# Patient Record
Sex: Male | Born: 1940
Health system: Southern US, Community
[De-identification: ages and names within clinical notes are randomized; demographics above are authoritative.]

## PROBLEM LIST (undated history)

## (undated) DIAGNOSIS — J189 Pneumonia, unspecified organism: Secondary | ICD-10-CM

## (undated) DIAGNOSIS — J628 Pneumoconiosis due to other dust containing silica: Secondary | ICD-10-CM

## (undated) DIAGNOSIS — Z87442 Personal history of urinary calculi: Secondary | ICD-10-CM

---

## 1992-07-23 ENCOUNTER — Encounter: Payer: Self-pay | Admitting: Pulmonary Disease

## 2008-08-26 ENCOUNTER — Ambulatory Visit: Payer: Self-pay | Admitting: Pulmonary Disease

## 2008-08-26 DIAGNOSIS — J342 Deviated nasal septum: Secondary | ICD-10-CM

## 2008-08-26 DIAGNOSIS — J628 Pneumoconiosis due to other dust containing silica: Secondary | ICD-10-CM

## 2008-08-26 DIAGNOSIS — J309 Allergic rhinitis, unspecified: Secondary | ICD-10-CM | POA: Insufficient documentation

## 2008-08-26 DIAGNOSIS — R05 Cough: Secondary | ICD-10-CM

## 2008-08-26 DIAGNOSIS — R059 Cough, unspecified: Secondary | ICD-10-CM | POA: Insufficient documentation

## 2008-08-26 LAB — CONVERTED CEMR LAB
BUN: 28 mg/dL — ABNORMAL HIGH (ref 6–23)
CO2: 29 meq/L (ref 19–32)
Calcium: 9 mg/dL (ref 8.4–10.5)
Chloride: 107 meq/L (ref 96–112)
Creatinine, Ser: 1.1 mg/dL (ref 0.4–1.5)
GFR calc Af Amer: 86 mL/min
GFR calc non Af Amer: 71 mL/min
Glucose, Bld: 126 mg/dL — ABNORMAL HIGH (ref 70–99)
Potassium: 3.9 meq/L (ref 3.5–5.1)
Sodium: 143 meq/L (ref 135–145)

## 2008-08-27 ENCOUNTER — Ambulatory Visit: Payer: Self-pay | Admitting: Pulmonary Disease

## 2008-08-27 ENCOUNTER — Ambulatory Visit: Payer: Self-pay | Admitting: Internal Medicine

## 2008-08-27 ENCOUNTER — Telehealth (INDEPENDENT_AMBULATORY_CARE_PROVIDER_SITE_OTHER): Payer: Self-pay | Admitting: *Deleted

## 2008-08-28 ENCOUNTER — Encounter: Payer: Self-pay | Admitting: Pulmonary Disease

## 2008-09-08 ENCOUNTER — Ambulatory Visit: Payer: Self-pay | Admitting: Pulmonary Disease

## 2010-06-30 ENCOUNTER — Inpatient Hospital Stay (HOSPITAL_COMMUNITY)
Admission: EM | Admit: 2010-06-30 | Discharge: 2010-07-06 | Payer: Self-pay | Source: Home / Self Care | Attending: Internal Medicine | Admitting: Internal Medicine

## 2010-07-29 NOTE — H&P (Signed)
[name], [name]:  [id number]  MEDICAL RECORD Jason.:  [id number]          PATIENT TYPE:  INP  LOCATION:  4707                         FACILITY:  [facility]  PHYSICIAN:  Massie Maroon, MD        DATE OF BIRTH:  [birth year]  DATE OF ADMISSION:  06/30/2010 DATE OF DISCHARGE:                             HISTORY & PHYSICAL   HISTORY OF PRESENT ILLNESS:  A 70 year old male with complaints of fever and chills.  The patient was worried that he might have some form of infection and presented to the ER.  His temperature was noted to be 102.3 in the ED and he had an elevated white count.  Chest x-ray showed extensive scarring and bronchiectasis throughout both lungs with involvement of scar in the upper lobes bilaterally consistent with known history of silicosis, not significantly changed since February 2010.  Jason new acute cardiopulmonary disease.  The patient's white count was elevated as stated before and the patient will be admitted for workup of possible pneumonia.  The patient will be admitted for workup of fever.  PAST MEDICAL HISTORY:  Allergic rhinitis.  PAST SURGICAL HISTORY:  T&A, colonoscopy 3 years ago.  SOCIAL HISTORY:  The patient lives at home.  He is married and has four children.  He used to be a Chief of Staff.  The patient does not smoke or drink at the present time.  FAMILY HISTORY:  Mother is alive at age 31.  Father died at age 17 from pneumonia and had a history of throat cancer.  He was a smoker.  ALLERGIES:  Jason known drug allergies.  MEDICATIONS:  Allegra, Flonase.  REVIEW OF SYSTEMS:  Negative for all 10 organ systems except for pertinent positives as stated above.  PHYSICAL EXAM:  VITAL SIGNS:  Temperature 102.3, pulse 158, blood pressures 100/65, pulse ox is 97% on room air. HEENT:  Anicteric. NECK:  Jason JVD, Jason bruit. HEART:  Regular rate and rhythm.  S1,S2. LUNGS:  Slight crackles at bilateral bases, Jason wheezes. ABDOMEN:   Soft, nontender, nondistended.  Positive bowel sounds. EXTREMITIES:  Jason cyanosis, clubbing, or edema. SKIN:  Jason rashes. LYMPH NODES:  Jason adenopathy. NEURO:  Nonfocal.  LABS:  Calcitonin 2.39, lactic acid 1.4.  Urinalysis negative.  Sodium 135, potassium 4.1, BUN 24, creatinine 1.33.  WBC 21.1, hemoglobin 14.2, platelet count 437 with slight left shift.  Chest x-ray as stated above HPI.  ASSESSMENT/PLAN:  Fever:  The patient's source of fever is most likely pulmonary.  He was treated with Levaquin for pneumonia and this may not have completely cleared his lung disease.  The patient will be started on vancomycin, IV ceftriaxone, and Zithromax.  Blood cultures x2 sets are pending.  We will repeat his CBC, CMP in the a.m.  The patient had Jason symptoms or signs of endocarditis.  If fevers persist and all blood cultures are positive, please consider obtaining a cardiac 2-D echo.  DVT prophylaxis, SCDs.     Massie Maroon, MD     JYK/MEDQ  D:  07/01/2010  T:  07/01/2010  Job:  [id number]  Electronically  Signed by Pearson Grippe MD on 07/29/2010 09:26:19 PM

## 2010-09-12 LAB — URINE CULTURE
Colony Count: NO GROWTH
Culture  Setup Time: 201112300054
Culture: NO GROWTH

## 2010-09-12 LAB — COMPREHENSIVE METABOLIC PANEL
ALT: 43 U/L (ref 0–53)
ALT: 47 U/L (ref 0–53)
ALT: 48 U/L (ref 0–53)
AST: 40 U/L — ABNORMAL HIGH (ref 0–37)
AST: 42 U/L — ABNORMAL HIGH (ref 0–37)
AST: 45 U/L — ABNORMAL HIGH (ref 0–37)
Albumin: 2.4 g/dL — ABNORMAL LOW (ref 3.5–5.2)
Albumin: 2.4 g/dL — ABNORMAL LOW (ref 3.5–5.2)
Albumin: 2.4 g/dL — ABNORMAL LOW (ref 3.5–5.2)
Alkaline Phosphatase: 118 U/L — ABNORMAL HIGH (ref 39–117)
Alkaline Phosphatase: 155 U/L — ABNORMAL HIGH (ref 39–117)
Alkaline Phosphatase: 181 U/L — ABNORMAL HIGH (ref 39–117)
BUN: 10 mg/dL (ref 6–23)
BUN: 15 mg/dL (ref 6–23)
BUN: 21 mg/dL (ref 6–23)
CO2: 24 mEq/L (ref 19–32)
CO2: 25 mEq/L (ref 19–32)
CO2: 28 mEq/L (ref 19–32)
Calcium: 8.5 mg/dL (ref 8.4–10.5)
Calcium: 8.7 mg/dL (ref 8.4–10.5)
Calcium: 9 mg/dL (ref 8.4–10.5)
Chloride: 100 mEq/L (ref 96–112)
Chloride: 103 mEq/L (ref 96–112)
Chloride: 105 mEq/L (ref 96–112)
Creatinine, Ser: 0.95 mg/dL (ref 0.4–1.5)
Creatinine, Ser: 0.98 mg/dL (ref 0.4–1.5)
Creatinine, Ser: 1.18 mg/dL (ref 0.4–1.5)
GFR calc Af Amer: >60 mL/min (ref >60)
GFR calc Af Amer: >60 mL/min (ref >60)
GFR calc Af Amer: >60 mL/min (ref >60)
GFR calc non Af Amer: >60 mL/min (ref >60)
GFR calc non Af Amer: >60 mL/min (ref >60)
GFR calc non Af Amer: >60 mL/min (ref >60)
Glucose, Bld: 108 mg/dL — ABNORMAL HIGH (ref 70–99)
Glucose, Bld: 136 mg/dL — ABNORMAL HIGH (ref 70–99)
Glucose, Bld: 146 mg/dL — ABNORMAL HIGH (ref 70–99)
Potassium: 4 mEq/L (ref 3.5–5.1)
Potassium: 4.1 mEq/L (ref 3.5–5.1)
Potassium: 4.2 mEq/L (ref 3.5–5.1)
Sodium: 135 mEq/L (ref 135–145)
Sodium: 137 mEq/L (ref 135–145)
Sodium: 138 mEq/L (ref 135–145)
Total Bilirubin: 0.6 mg/dL (ref 0.3–1.2)
Total Bilirubin: 0.7 mg/dL (ref 0.3–1.2)
Total Bilirubin: 0.9 mg/dL (ref 0.3–1.2)
Total Protein: 6.1 g/dL (ref 6.0–8.3)
Total Protein: 6.3 g/dL (ref 6.0–8.3)
Total Protein: 6.9 g/dL (ref 6.0–8.3)

## 2010-09-12 LAB — LACTIC ACID, PLASMA: Lactic Acid, Venous: 1.4 mmol/L (ref 0.5–2.2)

## 2010-09-12 LAB — BASIC METABOLIC PANEL
BUN: 24 mg/dL — ABNORMAL HIGH (ref 6–23)
CO2: 24 mEq/L (ref 19–32)
Calcium: 8.9 mg/dL (ref 8.4–10.5)
Chloride: 103 mEq/L (ref 96–112)
Creatinine, Ser: 1.33 mg/dL (ref 0.4–1.5)
GFR calc Af Amer: >60 mL/min (ref >60)
GFR calc non Af Amer: 53 mL/min — ABNORMAL LOW (ref >60)
Glucose, Bld: 137 mg/dL — ABNORMAL HIGH (ref 70–99)
Potassium: 4.1 mEq/L (ref 3.5–5.1)
Sodium: 135 mEq/L (ref 135–145)

## 2010-09-12 LAB — BLOOD GAS, ARTERIAL
Acid-base deficit: 0.3 mmol/L (ref 0.0–2.0)
Bicarbonate: 22.6 mEq/L (ref 20.0–24.0)
Drawn by: 23588
O2 Content: 2 L/min
O2 Saturation: 97.2 %
Patient temperature: 98.6
TCO2: 23.5 mmol/L (ref 0–100)
pCO2 arterial: 29.2 mmHg — ABNORMAL LOW (ref 35.0–45.0)
pH, Arterial: 7.499 — ABNORMAL HIGH (ref 7.350–7.450)
pO2, Arterial: 83.5 mmHg (ref 80.0–100.0)

## 2010-09-12 LAB — DIFFERENTIAL
Basophils Absolute: 0 10*3/uL (ref 0.0–0.1)
Basophils Absolute: 0 10*3/uL (ref 0.0–0.1)
Basophils Absolute: 0 10*3/uL (ref 0.0–0.1)
Basophils Absolute: 0 10*3/uL (ref 0.0–0.1)
Basophils Relative: 0 % (ref 0–1)
Basophils Relative: 0 % (ref 0–1)
Basophils Relative: 0 % (ref 0–1)
Basophils Relative: 0 % (ref 0–1)
Eosinophils Absolute: 0 10*3/uL (ref 0.0–0.7)
Eosinophils Absolute: 0.1 10*3/uL (ref 0.0–0.7)
Eosinophils Absolute: 0.1 10*3/uL (ref 0.0–0.7)
Eosinophils Absolute: 0.2 10*3/uL (ref 0.0–0.7)
Eosinophils Relative: 0 % (ref 0–5)
Eosinophils Relative: 1 % (ref 0–5)
Eosinophils Relative: 1 % (ref 0–5)
Eosinophils Relative: 1 % (ref 0–5)
Lymphocytes Relative: 10 % — ABNORMAL LOW (ref 12–46)
Lymphocytes Relative: 2 % — ABNORMAL LOW (ref 12–46)
Lymphocytes Relative: 5 % — ABNORMAL LOW (ref 12–46)
Lymphocytes Relative: 7 % — ABNORMAL LOW (ref 12–46)
Lymphs Abs: 0.5 10*3/uL — ABNORMAL LOW (ref 0.7–4.0)
Lymphs Abs: 0.7 10*3/uL (ref 0.7–4.0)
Lymphs Abs: 1.1 10*3/uL (ref 0.7–4.0)
Lymphs Abs: 1.5 10*3/uL (ref 0.7–4.0)
Monocytes Absolute: 1.6 10*3/uL — ABNORMAL HIGH (ref 0.1–1.0)
Monocytes Absolute: 1.7 10*3/uL — ABNORMAL HIGH (ref 0.1–1.0)
Monocytes Absolute: 1.8 10*3/uL — ABNORMAL HIGH (ref 0.1–1.0)
Monocytes Absolute: 1.8 10*3/uL — ABNORMAL HIGH (ref 0.1–1.0)
Monocytes Relative: 11 % (ref 3–12)
Monocytes Relative: 12 % (ref 3–12)
Monocytes Relative: 12 % (ref 3–12)
Monocytes Relative: 8 % (ref 3–12)
Neutro Abs: 11.3 10*3/uL — ABNORMAL HIGH (ref 1.7–7.7)
Neutro Abs: 11.5 10*3/uL — ABNORMAL HIGH (ref 1.7–7.7)
Neutro Abs: 11.8 10*3/uL — ABNORMAL HIGH (ref 1.7–7.7)
Neutro Abs: 18.8 10*3/uL — ABNORMAL HIGH (ref 1.7–7.7)
Neutrophils Relative %: 77 % (ref 43–77)
Neutrophils Relative %: 81 % — ABNORMAL HIGH (ref 43–77)
Neutrophils Relative %: 82 % — ABNORMAL HIGH (ref 43–77)
Neutrophils Relative %: 89 % — ABNORMAL HIGH (ref 43–77)

## 2010-09-12 LAB — CBC
HCT: 37.5 % — ABNORMAL LOW (ref 39.0–52.0)
HCT: 38.2 % — ABNORMAL LOW (ref 39.0–52.0)
HCT: 38.5 % — ABNORMAL LOW (ref 39.0–52.0)
HCT: 41.7 % (ref 39.0–52.0)
Hemoglobin: 12.3 g/dL — ABNORMAL LOW (ref 13.0–17.0)
Hemoglobin: 12.7 g/dL — ABNORMAL LOW (ref 13.0–17.0)
Hemoglobin: 12.9 g/dL — ABNORMAL LOW (ref 13.0–17.0)
Hemoglobin: 14.2 g/dL (ref 13.0–17.0)
MCH: 28 pg (ref 26.0–34.0)
MCH: 28.4 pg (ref 26.0–34.0)
MCH: 28.7 pg (ref 26.0–34.0)
MCH: 29 pg (ref 26.0–34.0)
MCHC: 32.8 g/dL (ref 30.0–36.0)
MCHC: 33.2 g/dL (ref 30.0–36.0)
MCHC: 33.5 g/dL (ref 30.0–36.0)
MCHC: 34.1 g/dL (ref 30.0–36.0)
MCV: 85.1 fL (ref 78.0–100.0)
MCV: 85.4 fL (ref 78.0–100.0)
MCV: 85.5 fL (ref 78.0–100.0)
MCV: 85.7 fL (ref 78.0–100.0)
Platelets: 349 10*3/uL (ref 150–400)
Platelets: 359 10*3/uL (ref 150–400)
Platelets: 400 10*3/uL (ref 150–400)
Platelets: 437 10*3/uL — ABNORMAL HIGH (ref 150–400)
RBC: 4.39 MIL/uL (ref 4.22–5.81)
RBC: 4.47 MIL/uL (ref 4.22–5.81)
RBC: 4.49 MIL/uL (ref 4.22–5.81)
RBC: 4.9 MIL/uL (ref 4.22–5.81)
RDW: 14 % (ref 11.5–15.5)
RDW: 14.2 % (ref 11.5–15.5)
RDW: 14.4 % (ref 11.5–15.5)
RDW: 14.5 % (ref 11.5–15.5)
WBC: 14 10*3/uL — ABNORMAL HIGH (ref 4.0–10.5)
WBC: 14.6 10*3/uL — ABNORMAL HIGH (ref 4.0–10.5)
WBC: 14.8 10*3/uL — ABNORMAL HIGH (ref 4.0–10.5)
WBC: 21.1 10*3/uL — ABNORMAL HIGH (ref 4.0–10.5)

## 2010-09-12 LAB — URINALYSIS, ROUTINE W REFLEX MICROSCOPIC
Glucose, UA: NEGATIVE mg/dL
Hgb urine dipstick: NEGATIVE
Ketones, ur: 15 mg/dL — AB
Nitrite: NEGATIVE
Protein, ur: NEGATIVE mg/dL
Specific Gravity, Urine: 1.028 (ref 1.005–1.030)
Urobilinogen, UA: 0.2 mg/dL (ref 0.0–1.0)
pH: 5.5 (ref 5.0–8.0)

## 2010-09-12 LAB — CULTURE, BLOOD (ROUTINE X 2)
Culture  Setup Time: 201112301108
Culture  Setup Time: 201112301108
Culture: NO GROWTH
Culture: NO GROWTH

## 2010-09-12 LAB — VANCOMYCIN, TROUGH: Vancomycin Tr: 12.6 ug/mL (ref 10.0–20.0)

## 2010-09-12 LAB — PROCALCITONIN: Procalcitonin: 2.39 ng/mL

## 2011-12-20 ENCOUNTER — Ambulatory Visit
Admission: RE | Admit: 2011-12-20 | Discharge: 2011-12-20 | Disposition: A | Discharge status: Home / Self Care | Payer: Medicare HMO | Source: Ambulatory Visit | Attending: *Deleted | Admitting: *Deleted

## 2011-12-20 ENCOUNTER — Other Ambulatory Visit: Payer: Self-pay | Admitting: *Deleted

## 2011-12-20 DIAGNOSIS — M79662 Pain in left lower leg: Secondary | ICD-10-CM

## 2014-08-12 DIAGNOSIS — Z23 Encounter for immunization: Secondary | ICD-10-CM | POA: Diagnosis not present

## 2014-08-12 DIAGNOSIS — J309 Allergic rhinitis, unspecified: Secondary | ICD-10-CM | POA: Diagnosis not present

## 2014-08-12 DIAGNOSIS — R030 Elevated blood-pressure reading, without diagnosis of hypertension: Secondary | ICD-10-CM | POA: Diagnosis not present

## 2014-08-12 DIAGNOSIS — E782 Mixed hyperlipidemia: Secondary | ICD-10-CM | POA: Diagnosis not present

## 2014-08-12 DIAGNOSIS — N200 Calculus of kidney: Secondary | ICD-10-CM | POA: Diagnosis not present

## 2014-08-12 DIAGNOSIS — R7309 Other abnormal glucose: Secondary | ICD-10-CM | POA: Diagnosis not present

## 2014-08-12 DIAGNOSIS — Z0000 Encounter for general adult medical examination without abnormal findings: Secondary | ICD-10-CM | POA: Diagnosis not present

## 2014-08-12 DIAGNOSIS — N183 Chronic kidney disease, stage 3 (moderate): Secondary | ICD-10-CM | POA: Diagnosis not present

## 2015-01-07 DIAGNOSIS — Z0100 Encounter for examination of eyes and vision without abnormal findings: Secondary | ICD-10-CM | POA: Diagnosis not present

## 2015-01-07 DIAGNOSIS — H524 Presbyopia: Secondary | ICD-10-CM | POA: Diagnosis not present

## 2015-01-07 DIAGNOSIS — H5210 Myopia, unspecified eye: Secondary | ICD-10-CM | POA: Diagnosis not present

## 2015-06-23 DIAGNOSIS — B353 Tinea pedis: Secondary | ICD-10-CM | POA: Diagnosis not present

## 2015-06-23 DIAGNOSIS — L570 Actinic keratosis: Secondary | ICD-10-CM | POA: Diagnosis not present

## 2015-06-23 DIAGNOSIS — L720 Epidermal cyst: Secondary | ICD-10-CM | POA: Diagnosis not present

## 2015-06-23 DIAGNOSIS — L218 Other seborrheic dermatitis: Secondary | ICD-10-CM | POA: Diagnosis not present

## 2015-06-23 DIAGNOSIS — B351 Tinea unguium: Secondary | ICD-10-CM | POA: Diagnosis not present

## 2015-06-23 DIAGNOSIS — L821 Other seborrheic keratosis: Secondary | ICD-10-CM | POA: Diagnosis not present

## 2015-07-06 DIAGNOSIS — J069 Acute upper respiratory infection, unspecified: Secondary | ICD-10-CM | POA: Diagnosis not present

## 2015-07-26 DIAGNOSIS — R05 Cough: Secondary | ICD-10-CM | POA: Diagnosis not present

## 2015-07-26 DIAGNOSIS — J310 Chronic rhinitis: Secondary | ICD-10-CM | POA: Diagnosis not present

## 2015-08-03 ENCOUNTER — Other Ambulatory Visit: Payer: Self-pay | Admitting: Internal Medicine

## 2015-08-03 ENCOUNTER — Ambulatory Visit
Admission: RE | Admit: 2015-08-03 | Discharge: 2015-08-03 | Disposition: A | Discharge status: Home / Self Care | Payer: Commercial Managed Care - HMO | Source: Ambulatory Visit | Attending: Internal Medicine | Admitting: Internal Medicine

## 2015-08-03 DIAGNOSIS — R05 Cough: Secondary | ICD-10-CM

## 2015-08-03 DIAGNOSIS — R059 Cough, unspecified: Secondary | ICD-10-CM

## 2015-08-03 DIAGNOSIS — J628 Pneumoconiosis due to other dust containing silica: Secondary | ICD-10-CM | POA: Diagnosis not present

## 2015-08-05 ENCOUNTER — Other Ambulatory Visit: Payer: Self-pay | Admitting: Internal Medicine

## 2015-08-05 DIAGNOSIS — J628 Pneumoconiosis due to other dust containing silica: Secondary | ICD-10-CM | POA: Diagnosis not present

## 2015-08-05 DIAGNOSIS — R9389 Abnormal findings on diagnostic imaging of other specified body structures: Secondary | ICD-10-CM

## 2015-08-05 DIAGNOSIS — R05 Cough: Secondary | ICD-10-CM

## 2015-08-05 DIAGNOSIS — R059 Cough, unspecified: Secondary | ICD-10-CM

## 2015-08-06 ENCOUNTER — Encounter (HOSPITAL_COMMUNITY): Payer: Self-pay | Admitting: *Deleted

## 2015-08-06 DIAGNOSIS — K567 Ileus, unspecified: Secondary | ICD-10-CM | POA: Diagnosis not present

## 2015-08-06 DIAGNOSIS — J628 Pneumoconiosis due to other dust containing silica: Secondary | ICD-10-CM | POA: Diagnosis present

## 2015-08-06 DIAGNOSIS — R0600 Dyspnea, unspecified: Secondary | ICD-10-CM | POA: Diagnosis not present

## 2015-08-06 DIAGNOSIS — K819 Cholecystitis, unspecified: Secondary | ICD-10-CM | POA: Diagnosis not present

## 2015-08-06 DIAGNOSIS — K810 Acute cholecystitis: Secondary | ICD-10-CM | POA: Diagnosis present

## 2015-08-06 DIAGNOSIS — R109 Unspecified abdominal pain: Secondary | ICD-10-CM | POA: Diagnosis not present

## 2015-08-06 DIAGNOSIS — R1084 Generalized abdominal pain: Secondary | ICD-10-CM | POA: Diagnosis present

## 2015-08-06 DIAGNOSIS — K812 Acute cholecystitis with chronic cholecystitis: Secondary | ICD-10-CM | POA: Diagnosis not present

## 2015-08-06 NOTE — ED Notes (Signed)
The pt ate salmon at a seafood place tonight  And shortly afterward he has had severe abd pain  And burping  No n v or diarrhea.   hehas a grey color to his skin and his abd is distended.  No one else was sick from the food.  .  He has had a cough for 5 weeks.

## 2015-08-07 ENCOUNTER — Observation Stay (HOSPITAL_COMMUNITY): Payer: Commercial Managed Care - HMO | Admitting: Anesthesiology

## 2015-08-07 ENCOUNTER — Inpatient Hospital Stay (HOSPITAL_COMMUNITY)
Admission: EM | Admit: 2015-08-07 | Discharge: 2015-08-10 | DRG: 418 | Disposition: A | Discharge status: Home / Self Care | Payer: Commercial Managed Care - HMO | Attending: General Surgery | Admitting: General Surgery

## 2015-08-07 ENCOUNTER — Encounter (HOSPITAL_COMMUNITY): Payer: Self-pay | Admitting: *Deleted

## 2015-08-07 ENCOUNTER — Encounter (HOSPITAL_COMMUNITY): Admission: EM | Disposition: A | Discharge status: Home / Self Care | Payer: Self-pay | Source: Home / Self Care

## 2015-08-07 ENCOUNTER — Emergency Department (HOSPITAL_COMMUNITY): Payer: Commercial Managed Care - HMO

## 2015-08-07 DIAGNOSIS — J628 Pneumoconiosis due to other dust containing silica: Secondary | ICD-10-CM

## 2015-08-07 DIAGNOSIS — K810 Acute cholecystitis: Secondary | ICD-10-CM | POA: Diagnosis present

## 2015-08-07 DIAGNOSIS — K812 Acute cholecystitis with chronic cholecystitis: Secondary | ICD-10-CM | POA: Diagnosis not present

## 2015-08-07 HISTORY — DX: Pneumoconiosis due to other dust containing silica: J62.8

## 2015-08-07 HISTORY — PX: CHOLECYSTECTOMY: SHX55

## 2015-08-07 HISTORY — DX: Pneumonia, unspecified organism: J18.9

## 2015-08-07 HISTORY — DX: Personal history of urinary calculi: Z87.442

## 2015-08-07 LAB — CBC
HEMATOCRIT: 37.9 % — AB (ref 39.0–52.0)
HEMATOCRIT: 43.8 % (ref 39.0–52.0)
HEMOGLOBIN: 12.9 g/dL — AB (ref 13.0–17.0)
Hemoglobin: 15 g/dL (ref 13.0–17.0)
MCH: 29.5 pg (ref 26.0–34.0)
MCH: 29.9 pg (ref 26.0–34.0)
MCHC: 34 g/dL (ref 30.0–36.0)
MCHC: 34.2 g/dL (ref 30.0–36.0)
MCV: 86.5 fL (ref 78.0–100.0)
MCV: 87.3 fL (ref 78.0–100.0)
PLATELETS: 220 10*3/uL (ref 150–400)
Platelets: 186 10*3/uL (ref 150–400)
RBC: 4.38 MIL/uL (ref 4.22–5.81)
RBC: 5.02 MIL/uL (ref 4.22–5.81)
RDW: 14.5 % (ref 11.5–15.5)
RDW: 14.5 % (ref 11.5–15.5)
WBC: 12.2 10*3/uL — AB (ref 4.0–10.5)
WBC: 14.3 10*3/uL — ABNORMAL HIGH (ref 4.0–10.5)

## 2015-08-07 LAB — COMPREHENSIVE METABOLIC PANEL
ALBUMIN: 3.7 g/dL (ref 3.5–5.0)
ALK PHOS: 44 U/L (ref 38–126)
ALT: 23 U/L (ref 17–63)
AST: 24 U/L (ref 15–41)
Anion gap: 10 (ref 5–15)
BUN: 17 mg/dL (ref 6–20)
CO2: 27 mmol/L (ref 22–32)
CREATININE: 1.23 mg/dL (ref 0.61–1.24)
Calcium: 9.7 mg/dL (ref 8.9–10.3)
Chloride: 99 mmol/L — ABNORMAL LOW (ref 101–111)
GFR calc Af Amer: >60 mL/min (ref >60)
GFR calc non Af Amer: 56 mL/min — ABNORMAL LOW (ref >60)
GLUCOSE: 150 mg/dL — AB (ref 65–99)
POTASSIUM: 4.9 mmol/L (ref 3.5–5.1)
Sodium: 136 mmol/L (ref 135–145)
Total Bilirubin: 0.7 mg/dL (ref 0.3–1.2)
Total Protein: 7 g/dL (ref 6.5–8.1)

## 2015-08-07 LAB — POCT I-STAT 4, (NA,K, GLUC, HGB,HCT)
Glucose, Bld: 173 mg/dL — ABNORMAL HIGH (ref 65–99)
HCT: 37 % — ABNORMAL LOW (ref 39.0–52.0)
HEMOGLOBIN: 12.6 g/dL — AB (ref 13.0–17.0)
Potassium: 4.5 mmol/L (ref 3.5–5.1)
SODIUM: 136 mmol/L (ref 135–145)

## 2015-08-07 LAB — URINALYSIS, ROUTINE W REFLEX MICROSCOPIC
BILIRUBIN URINE: NEGATIVE
GLUCOSE, UA: NEGATIVE mg/dL
Hgb urine dipstick: NEGATIVE
KETONES UR: NEGATIVE mg/dL
Leukocytes, UA: NEGATIVE
NITRITE: NEGATIVE
PH: 7.5 (ref 5.0–8.0)
Protein, ur: NEGATIVE mg/dL
Specific Gravity, Urine: 1.019 (ref 1.005–1.030)

## 2015-08-07 LAB — TROPONIN I: Troponin I: <0.03 ng/mL (ref <0.031)

## 2015-08-07 LAB — LIPASE, BLOOD: Lipase: 43 U/L (ref 11–51)

## 2015-08-07 SURGERY — LAPAROSCOPIC CHOLECYSTECTOMY WITH INTRAOPERATIVE CHOLANGIOGRAM
Anesthesia: General | Site: Abdomen

## 2015-08-07 MED ORDER — SCOPOLAMINE 1 MG/3DAYS TD PT72
MEDICATED_PATCH | TRANSDERMAL | Status: DC | PRN
  Administered 2015-08-07: 1 via TRANSDERMAL

## 2015-08-07 MED ORDER — MOMETASONE FURO-FORMOTEROL FUM 100-5 MCG/ACT IN AERO
2.0000 | INHALATION_SPRAY | Freq: Two times a day (BID) | RESPIRATORY_TRACT | Status: DC
  Administered 2015-08-07 – 2015-08-10 (×6): 2 via RESPIRATORY_TRACT
  Filled 2015-08-07: qty 8.8

## 2015-08-07 MED ORDER — PROPOFOL 10 MG/ML IV BOLUS
INTRAVENOUS | Status: AC
  Filled 2015-08-07: qty 20

## 2015-08-07 MED ORDER — BUPIVACAINE-EPINEPHRINE 0.25% -1:200000 IJ SOLN
INTRAMUSCULAR | Status: DC | PRN
  Administered 2015-08-07: 20 mL

## 2015-08-07 MED ORDER — MORPHINE SULFATE (PF) 2 MG/ML IV SOLN: 2.0000 mg | INTRAVENOUS | Status: DC | PRN

## 2015-08-07 MED ORDER — HYDROCOD POLST-CPM POLST ER 10-8 MG/5ML PO SUER
5.0000 mL | Freq: Every day | ORAL | Status: DC
  Administered 2015-08-07 – 2015-08-09 (×3): 5 mL via ORAL
  Filled 2015-08-07 (×3): qty 5

## 2015-08-07 MED ORDER — FENTANYL CITRATE (PF) 250 MCG/5ML IJ SOLN
INTRAMUSCULAR | Status: DC | PRN
  Administered 2015-08-07: 100 ug via INTRAVENOUS
  Administered 2015-08-07: 150 ug via INTRAVENOUS

## 2015-08-07 MED ORDER — ESMOLOL HCL 100 MG/10ML IV SOLN
INTRAVENOUS | Status: AC
  Filled 2015-08-07: qty 10

## 2015-08-07 MED ORDER — METOCLOPRAMIDE HCL 10 MG PO TABS
10.0000 mg | ORAL_TABLET | Freq: Once | ORAL | Status: AC
  Administered 2015-08-07: 10 mg via ORAL
  Filled 2015-08-07: qty 1

## 2015-08-07 MED ORDER — PIPERACILLIN-TAZOBACTAM 3.375 G IVPB
3.3750 g | Freq: Three times a day (TID) | INTRAVENOUS | Status: DC
  Administered 2015-08-07 – 2015-08-10 (×8): 3.375 g via INTRAVENOUS
  Filled 2015-08-07 (×11): qty 50

## 2015-08-07 MED ORDER — PIPERACILLIN-TAZOBACTAM 3.375 G IVPB 30 MIN
3.3750 g | Freq: Once | INTRAVENOUS | Status: AC
  Administered 2015-08-07: 3.375 g via INTRAVENOUS
  Filled 2015-08-07: qty 50

## 2015-08-07 MED ORDER — VITAMIN D 1000 UNITS PO TABS
1000.0000 [IU] | ORAL_TABLET | Freq: Every day | ORAL | Status: DC
  Administered 2015-08-08 – 2015-08-10 (×3): 1000 [IU] via ORAL
  Filled 2015-08-07 (×3): qty 1

## 2015-08-07 MED ORDER — GLYCOPYRROLATE 0.2 MG/ML IJ SOLN
INTRAMUSCULAR | Status: AC
  Filled 2015-08-07: qty 2

## 2015-08-07 MED ORDER — BENZONATATE 100 MG PO CAPS: 200.0000 mg | ORAL_CAPSULE | Freq: Three times a day (TID) | ORAL | Status: DC | PRN

## 2015-08-07 MED ORDER — POTASSIUM CHLORIDE IN NACL 20-0.9 MEQ/L-% IV SOLN
INTRAVENOUS | Status: DC
  Administered 2015-08-07 – 2015-08-08 (×2): via INTRAVENOUS
  Filled 2015-08-07 (×2): qty 1000

## 2015-08-07 MED ORDER — BUPIVACAINE-EPINEPHRINE (PF) 0.25% -1:200000 IJ SOLN
INTRAMUSCULAR | Status: AC
  Filled 2015-08-07: qty 30

## 2015-08-07 MED ORDER — 0.9 % SODIUM CHLORIDE (POUR BTL) OPTIME
TOPICAL | Status: DC | PRN
  Administered 2015-08-07: 1000 mL

## 2015-08-07 MED ORDER — OXYCODONE HCL 5 MG PO TABS
5.0000 mg | ORAL_TABLET | ORAL | Status: DC | PRN
  Administered 2015-08-07 – 2015-08-09 (×5): 10 mg via ORAL
  Administered 2015-08-09: 5 mg via ORAL
  Filled 2015-08-07: qty 1
  Filled 2015-08-07 (×5): qty 2

## 2015-08-07 MED ORDER — BENZONATATE 100 MG PO CAPS
200.0000 mg | ORAL_CAPSULE | Freq: Three times a day (TID) | ORAL | Status: DC | PRN
  Administered 2015-08-07 – 2015-08-10 (×5): 200 mg via ORAL
  Filled 2015-08-07 (×5): qty 2

## 2015-08-07 MED ORDER — ESMOLOL HCL 100 MG/10ML IV SOLN
INTRAVENOUS | Status: DC | PRN
  Administered 2015-08-07: 40 mg via INTRAVENOUS

## 2015-08-07 MED ORDER — KCL IN DEXTROSE-NACL 20-5-0.45 MEQ/L-%-% IV SOLN: INTRAVENOUS | Status: DC

## 2015-08-07 MED ORDER — FLUTICASONE PROPIONATE 50 MCG/ACT NA SUSP
1.0000 | Freq: Every day | NASAL | Status: DC
  Administered 2015-08-08 – 2015-08-10 (×3): 2 via NASAL
  Filled 2015-08-07 (×3): qty 16

## 2015-08-07 MED ORDER — ONDANSETRON HCL 4 MG/2ML IJ SOLN
INTRAMUSCULAR | Status: DC | PRN
  Administered 2015-08-07: 4 mg via INTRAVENOUS

## 2015-08-07 MED ORDER — HYDROMORPHONE HCL 1 MG/ML IJ SOLN: 0.2500 mg | INTRAMUSCULAR | Status: DC | PRN

## 2015-08-07 MED ORDER — DEXAMETHASONE SODIUM PHOSPHATE 4 MG/ML IJ SOLN
INTRAMUSCULAR | Status: DC | PRN
  Administered 2015-08-07: 4 mg via INTRAVENOUS

## 2015-08-07 MED ORDER — ONDANSETRON HCL 4 MG/2ML IJ SOLN: 4.0000 mg | Freq: Four times a day (QID) | INTRAMUSCULAR | Status: DC | PRN

## 2015-08-07 MED ORDER — DEXAMETHASONE SODIUM PHOSPHATE 4 MG/ML IJ SOLN
INTRAMUSCULAR | Status: AC
  Filled 2015-08-07: qty 1

## 2015-08-07 MED ORDER — NEOSTIGMINE METHYLSULFATE 10 MG/10ML IV SOLN
INTRAVENOUS | Status: AC
  Filled 2015-08-07: qty 1

## 2015-08-07 MED ORDER — MIDAZOLAM HCL 2 MG/2ML IJ SOLN
INTRAMUSCULAR | Status: DC | PRN
  Administered 2015-08-07: 2 mg via INTRAVENOUS

## 2015-08-07 MED ORDER — ROCURONIUM BROMIDE 50 MG/5ML IV SOLN
INTRAVENOUS | Status: AC
  Filled 2015-08-07: qty 1

## 2015-08-07 MED ORDER — FENTANYL CITRATE (PF) 250 MCG/5ML IJ SOLN
INTRAMUSCULAR | Status: AC
  Filled 2015-08-07: qty 5

## 2015-08-07 MED ORDER — ONDANSETRON HCL 4 MG/2ML IJ SOLN
INTRAMUSCULAR | Status: AC
  Filled 2015-08-07: qty 2

## 2015-08-07 MED ORDER — MIDAZOLAM HCL 2 MG/2ML IJ SOLN
INTRAMUSCULAR | Status: AC
  Filled 2015-08-07: qty 2

## 2015-08-07 MED ORDER — SODIUM CHLORIDE 0.9 % IR SOLN
Status: DC | PRN
  Administered 2015-08-07 (×2): 1000 mL

## 2015-08-07 MED ORDER — PROPOFOL 10 MG/ML IV BOLUS
INTRAVENOUS | Status: DC | PRN
  Administered 2015-08-07: 160 mg via INTRAVENOUS
  Administered 2015-08-07: 40 mg via INTRAVENOUS

## 2015-08-07 MED ORDER — LIDOCAINE HCL (CARDIAC) 20 MG/ML IV SOLN
INTRAVENOUS | Status: DC | PRN
  Administered 2015-08-07: 60 mg via INTRATRACHEAL

## 2015-08-07 MED ORDER — PIPERACILLIN-TAZOBACTAM 3.375 G IVPB: 3.3750 g | Freq: Three times a day (TID) | INTRAVENOUS | Status: DC

## 2015-08-07 MED ORDER — ONDANSETRON 4 MG PO TBDP: 4.0000 mg | ORAL_TABLET | Freq: Four times a day (QID) | ORAL | Status: DC | PRN

## 2015-08-07 MED ORDER — CEFAZOLIN SODIUM-DEXTROSE 2-3 GM-% IV SOLR
INTRAVENOUS | Status: DC | PRN
  Administered 2015-08-07: 2 g via INTRAVENOUS

## 2015-08-07 MED ORDER — GLYCOPYRROLATE 0.2 MG/ML IJ SOLN
INTRAMUSCULAR | Status: DC | PRN
  Administered 2015-08-07: 0.4 mg via INTRAVENOUS

## 2015-08-07 MED ORDER — ACETAMINOPHEN 325 MG PO TABS: 650.0000 mg | ORAL_TABLET | Freq: Four times a day (QID) | ORAL | Status: DC | PRN

## 2015-08-07 MED ORDER — MORPHINE SULFATE (PF) 2 MG/ML IV SOLN
1.0000 mg | INTRAVENOUS | Status: DC | PRN
  Administered 2015-08-07 (×4): 2 mg via INTRAVENOUS
  Filled 2015-08-07 (×4): qty 1

## 2015-08-07 MED ORDER — SCOPOLAMINE 1 MG/3DAYS TD PT72
MEDICATED_PATCH | TRANSDERMAL | Status: AC
  Filled 2015-08-07: qty 1

## 2015-08-07 MED ORDER — SUCCINYLCHOLINE CHLORIDE 20 MG/ML IJ SOLN
INTRAMUSCULAR | Status: DC | PRN
  Administered 2015-08-07: 20 mg via INTRAVENOUS

## 2015-08-07 MED ORDER — OXYCODONE HCL 5 MG PO TABS: 5.0000 mg | ORAL_TABLET | ORAL | Status: DC | PRN

## 2015-08-07 MED ORDER — DICYCLOMINE HCL 10 MG PO CAPS
20.0000 mg | ORAL_CAPSULE | Freq: Once | ORAL | Status: AC
  Administered 2015-08-07: 20 mg via ORAL
  Filled 2015-08-07: qty 2

## 2015-08-07 MED ORDER — NEOSTIGMINE METHYLSULFATE 10 MG/10ML IV SOLN
INTRAVENOUS | Status: DC | PRN
  Administered 2015-08-07: 2 mg via INTRAVENOUS

## 2015-08-07 MED ORDER — OXYCODONE HCL 5 MG PO TABS: 5.0000 mg | ORAL_TABLET | Freq: Once | ORAL | Status: DC | PRN

## 2015-08-07 MED ORDER — ACETAMINOPHEN 650 MG RE SUPP: 650.0000 mg | Freq: Four times a day (QID) | RECTAL | Status: DC | PRN

## 2015-08-07 MED ORDER — LACTATED RINGERS IV SOLN
INTRAVENOUS | Status: DC | PRN
  Administered 2015-08-07 (×3): via INTRAVENOUS

## 2015-08-07 MED ORDER — OXYCODONE HCL 5 MG/5ML PO SOLN: 5.0000 mg | Freq: Once | ORAL | Status: DC | PRN

## 2015-08-07 MED ORDER — LIDOCAINE HCL (CARDIAC) 20 MG/ML IV SOLN
INTRAVENOUS | Status: AC
  Filled 2015-08-07: qty 5

## 2015-08-07 MED ORDER — SUCCINYLCHOLINE CHLORIDE 20 MG/ML IJ SOLN
INTRAMUSCULAR | Status: AC
  Filled 2015-08-07: qty 1

## 2015-08-07 MED ORDER — IOHEXOL 300 MG/ML  SOLN
100.0000 mL | Freq: Once | INTRAMUSCULAR | Status: AC | PRN
  Administered 2015-08-07: 100 mL via INTRAVENOUS

## 2015-08-07 MED ORDER — LORATADINE 10 MG PO TABS
10.0000 mg | ORAL_TABLET | Freq: Every day | ORAL | Status: DC
  Administered 2015-08-08 – 2015-08-10 (×3): 10 mg via ORAL
  Filled 2015-08-07 (×3): qty 1

## 2015-08-07 MED ORDER — PHENYLEPHRINE HCL 10 MG/ML IJ SOLN
INTRAMUSCULAR | Status: DC | PRN
  Administered 2015-08-07: 100 ug via INTRAVENOUS

## 2015-08-07 MED ORDER — ROCURONIUM BROMIDE 100 MG/10ML IV SOLN
INTRAVENOUS | Status: DC | PRN
  Administered 2015-08-07: 5 mg via INTRAVENOUS
  Administered 2015-08-07: 15 mg via INTRAVENOUS

## 2015-08-07 SURGICAL SUPPLY — 41 items
APPLIER CLIP 5 13 M/L LIGAMAX5 (MISCELLANEOUS) ×6
BLADE SURG CLIPPER 3M 9600 (MISCELLANEOUS) IMPLANT
CANISTER SUCTION 2500CC (MISCELLANEOUS) ×3 IMPLANT
CHLORAPREP W/TINT 26ML (MISCELLANEOUS) ×3 IMPLANT
CLIP APPLIE 5 13 M/L LIGAMAX5 (MISCELLANEOUS) ×2 IMPLANT
COVER MAYO STAND STRL (DRAPES) ×3 IMPLANT
COVER SURGICAL LIGHT HANDLE (MISCELLANEOUS) ×3 IMPLANT
DERMABOND ADVANCED (GAUZE/BANDAGES/DRESSINGS) ×2
DERMABOND ADVANCED .7 DNX12 (GAUZE/BANDAGES/DRESSINGS) ×1 IMPLANT
DRAIN CHANNEL 19F RND (DRAIN) ×3 IMPLANT
DRAPE C-ARM 42X72 X-RAY (DRAPES) ×3 IMPLANT
ELECT REM PT RETURN 9FT ADLT (ELECTROSURGICAL) ×3
ELECTRODE REM PT RTRN 9FT ADLT (ELECTROSURGICAL) ×1 IMPLANT
EVACUATOR SILICONE 100CC (DRAIN) ×3 IMPLANT
GLOVE SURG SIGNA 7.5 PF LTX (GLOVE) ×3 IMPLANT
GOWN STRL REUS W/ TWL LRG LVL3 (GOWN DISPOSABLE) ×2 IMPLANT
GOWN STRL REUS W/ TWL XL LVL3 (GOWN DISPOSABLE) ×1 IMPLANT
GOWN STRL REUS W/TWL LRG LVL3 (GOWN DISPOSABLE) ×4
GOWN STRL REUS W/TWL XL LVL3 (GOWN DISPOSABLE) ×2
HEMOSTAT SNOW SURGICEL 2X4 (HEMOSTASIS) ×6 IMPLANT
KIT BASIN OR (CUSTOM PROCEDURE TRAY) ×3 IMPLANT
KIT ROOM TURNOVER OR (KITS) ×3 IMPLANT
LIQUID BAND (GAUZE/BANDAGES/DRESSINGS) ×12 IMPLANT
NS IRRIG 1000ML POUR BTL (IV SOLUTION) ×3 IMPLANT
PAD ARMBOARD 7.5X6 YLW CONV (MISCELLANEOUS) ×3 IMPLANT
POUCH SPECIMEN RETRIEVAL 10MM (ENDOMECHANICALS) ×3 IMPLANT
SCISSORS LAP 5X35 DISP (ENDOMECHANICALS) ×3 IMPLANT
SET CHOLANGIOGRAPH 5 50 .035 (SET/KITS/TRAYS/PACK) ×3 IMPLANT
SET IRRIG TUBING LAPAROSCOPIC (IRRIGATION / IRRIGATOR) ×3 IMPLANT
SLEEVE ENDOPATH XCEL 5M (ENDOMECHANICALS) ×6 IMPLANT
SPECIMEN JAR SMALL (MISCELLANEOUS) ×3 IMPLANT
SUT ETHILON 2 0 FS 18 (SUTURE) ×3 IMPLANT
SUT MON AB 4-0 PC3 18 (SUTURE) ×3 IMPLANT
SUT VICRYL 0 UR6 27IN ABS (SUTURE) ×3 IMPLANT
TOWEL OR 17X24 6PK STRL BLUE (TOWEL DISPOSABLE) ×3 IMPLANT
TOWEL OR 17X26 10 PK STRL BLUE (TOWEL DISPOSABLE) ×3 IMPLANT
TRAY LAPAROSCOPIC MC (CUSTOM PROCEDURE TRAY) ×3 IMPLANT
TROCAR XCEL BLUNT TIP 100MML (ENDOMECHANICALS) ×3 IMPLANT
TROCAR XCEL NON-BLD 11X100MML (ENDOMECHANICALS) ×3 IMPLANT
TROCAR XCEL NON-BLD 5MMX100MML (ENDOMECHANICALS) ×3 IMPLANT
TUBING INSUFFLATION (TUBING) ×3 IMPLANT

## 2015-08-07 NOTE — Anesthesia Procedure Notes (Signed)
Procedure Name: Intubation Date/Time: 08/07/2015 8:52 AM Performed by: Marinda Elk A Pre-anesthesia Checklist: Patient identified, Emergency Drugs available, Suction available, Patient being monitored and Timeout performed Patient Re-evaluated:Patient Re-evaluated prior to inductionOxygen Delivery Method: Circle system utilized Preoxygenation: Pre-oxygenation with 100% oxygen Intubation Type: IV induction Ventilation: Mask ventilation without difficulty Laryngoscope Size: Mac and 3 Grade View: Grade I Tube type: Oral Tube size: 7.5 mm Number of attempts: 1 Airway Equipment and Method: Stylet Placement Confirmation: ETT inserted through vocal cords under direct vision,  positive ETCO2 and breath sounds checked- equal and bilateral Secured at: 22 cm Tube secured with: Tape Dental Injury: Teeth and Oropharynx as per pre-operative assessment

## 2015-08-07 NOTE — Transfer of Care (Signed)
Immediate Anesthesia Transfer of Care Note  Patient: Jason Hill  Procedure(s) Performed: Procedure(s): LAPAROSCOPIC CHOLECYSTECTOMY WITH POSSIBLE INTRAOPERATIVE CHOLANGIOGRAM (N/A)  Patient Location: PACU  Anesthesia Type:General  Level of Consciousness: awake  Airway & Oxygen Therapyface mask oxygen  Post-op Assessment: Report given to RN and Post -op Vital signs reviewed and stable  Post vital signs: Reviewed and stable  Last Vitals:  Filed Vitals:   08/07/15 0730 08/07/15 0745  BP: 129/79 133/73  Pulse: 83   Temp:    Resp:      Complications: No apparent anesthesia complications

## 2015-08-07 NOTE — Op Note (Signed)
LAPAROSCOPIC CHOLECYSTECTOMY WITH POSSIBLE INTRAOPERATIVE CHOLANGIOGRAM  Procedure Note  Jason Hill 08/07/2015   Pre-op Diagnosis: acute cholecystitis     Post-op Diagnosis: acute cholecystitis  Procedure(s): LAPAROSCOPIC CHOLECYSTECTOMY   Surgeon(s): Coralie Keens, MD  Anesthesia: General  Staff:  Circulator: Lenora Boys, RN Scrub Person: Starla Link, RN; Lorenza Burton, CST  Estimated Blood Loss: 1000               Specimens: sent to path          [facility] A   Date: 08/07/2015  Time: 9:59 AM

## 2015-08-07 NOTE — Anesthesia Postprocedure Evaluation (Signed)
Anesthesia Post Note  Patient: Jason Hill  Procedure(s) Performed: Procedure(s) (LRB): LAPAROSCOPIC CHOLECYSTECTOMY WITH POSSIBLE INTRAOPERATIVE CHOLANGIOGRAM (N/A)  Patient location during evaluation: PACU Anesthesia Type: General Level of consciousness: awake and alert and patient cooperative Pain management: pain level controlled Vital Signs Assessment: post-procedure vital signs reviewed and stable Respiratory status: spontaneous breathing and respiratory function stable Cardiovascular status: stable Anesthetic complications: no    Last Vitals:  Filed Vitals:   08/07/15 1132 08/07/15 1409  BP: 120/63 121/72  Pulse: 81 97  Temp: 36.9 C 36.6 C  Resp: 18 15    Last Pain:  Filed Vitals:   08/07/15 1448  PainSc: [place]

## 2015-08-07 NOTE — Care Management Obs Status (Signed)
MEDICARE OBSERVATION STATUS NOTIFICATION   Patient Details  Name: Jason Hill MRN: [id number] Date of Birth: [birth year]   Medicare Observation Status Notification Given:  Yes    Guido Sander, RN 08/07/2015, 4:12 PM

## 2015-08-07 NOTE — Op Note (Signed)
Jason Hill, Jason Hill NO.:  [id number]  MEDICAL RECORD NO.:  [id number]  LOCATION:  [facility]                         FACILITY:  [place]  PHYSICIAN:  Coralie Keens, M.D. DATE OF BIRTH:  1941-03-04  DATE OF PROCEDURE:  08/07/2015 DATE OF DISCHARGE:                              OPERATIVE REPORT   PREOPERATIVE DIAGNOSIS:  Acute cholecystitis.  POSTOPERATIVE DIAGNOSIS:  Acute cholecystitis.  PROCEDURE:  Laparoscopic cholecystectomy.  SURGEON:  Coralie Keens, M.D.  ANESTHESIA:  General endotracheal anesthesia.  ESTIMATED BLOOD LOSS:  1000 mL.  INDICATIONS:  This is a 75 year old gentleman who presents with abdominal pain, nausea and vomiting.  He was found to have acute cholecystitis on CT scan of the abdomen and pelvis.  Decision was made to proceed to the operating room.  FINDINGS:  The patient was found to have acute cholecystitis with the gallbladder completely imbedded in the liver.  I had to leave the back wall of the gallbladder.  Omentum was completely plastered to the gallbladder as well and I had to clip several blood vessels in the omentum taking it off the gallbladder.  The gallbladder itself was very friable with a moderate amount of oozing of blood.  PROCEDURE IN DETAIL:  The patient was brought to the operating room, identified as Jason Hill.  He was placed supine on the operating room table and general anesthesia was induced.  His abdomen was then prepped and draped in usual sterile fashion.  I made a small vertical incision below the umbilicus.  I carried this down to the fascia, which was then opened with scalpel.  Hemostat was used to pass it to the peritoneal cavity under direct vision.  Next, a 0 Vicryl pursestring suture was placed around the fascial opening.  The Hasson port was placed to the opening and insufflation of the abdomen was begun.  I placed a 5-mm trocar in the patient's epigastrium, tumor of the right upper  quadrant under direct vision.  The omentum was found to be completely stuck to the gallbladder.  I was able to grasp the gallbladder slightly, but it was very difficult to peel the omentum off the gallbladder.  Several bleeding areas in the omentum had to be controlled with surgical clips. Also the moderate amount of blood trying to do this.  I then grasped the gallbladder and retracted it above the liver.  The gallbladder was very friable and medially tore.  I was able to identify the cystic duct and achieve a critical window around it.  I clipped it three times proximally, once distally and transected.  The cystic artery was then identified and clipped proximally, distally and transected as well.  The gallbladder itself was very friable and kept falling apart in pieces. There was a large amount of bleeding from the gallbladder fossa.  I had to leave the back wall of the gallbladder as the gallbladder fell completely apart as I was trying to dissect it off the liver.  At this point, several pieces of surgical snow were brought into the field and placed in the gallbladder fossa to help with hemostasis.  At this point, I removed the gallbladder completely and placed it into the  sac and removed it through the incision at the umbilicus.  I then irrigated the abdomen with several liters of normal saline.  Again, continued to be oozing from the liver bed and the gallbladder fossa, but I was able to finally achieve hemostasis with the cautery and the surgical snow.  At this point, I placed a 19-French Blake drain through the lateral trocar site and removed the trocar.  I placed this into the gallbladder fossa. I again closely evaluated the gallbladder fossa and hemostasis at this point appeared to be achieved.  All ports were removed under direct vision.  The abdomen was deflated.  The 0 Vicryl at the umbilicus tied in place closing the fascial defect.  I sewed the Blake drain in with a 2-0  nylon suture.  All incisions were then anesthetized with Marcaine and closed with 4-0 Monocryl subcuticular sutures.  Skin glue was then applied.  The patient tolerated the procedure well.  All the counts were correct at the end of procedure.  The patient was then extubated in the operating room and taken in a stable condition to the recovery room.     Coralie Keens, M.D.     DB/MEDQ  D:  08/07/2015  T:  08/07/2015  Job:  [id number]

## 2015-08-07 NOTE — Progress Notes (Signed)
Patient ID: Jason Hill, male   DOB: [birth year], 75 y.o.   MRN: [id number]  Will proceed with a lap chole and IOC for cholecystitis I discussed the procedure in detail. .  We discussed the risks and benefits of a laparoscopic cholecystectomy and cholangiogram including, but not limited to bleeding, infection, injury to surrounding structures such as the intestine or liver, bile leak, retained gallstones, need to convert to an open procedure, prolonged diarrhea, blood clots such as  DVT, common bile duct injury, anesthesia risks, and possible need for additional procedures.  The likelihood of improvement in symptoms and return to the patient's normal status is good. We discussed the typical post-operative recovery course.

## 2015-08-07 NOTE — Progress Notes (Addendum)
Per Dr. Ninfa Linden patient does not need to be consented for blood order cancelled. Dr. Ninfa Linden filled out consent upon arrival to short stay working is different that what Dr. Kieth Brightly placed in computer.

## 2015-08-07 NOTE — ED Provider Notes (Signed)
CSN: [id number]     Arrival date & time 08/06/15  2329 History  By signing my name below, I, [name], attest that this documentation has been prepared under the direction and in the presence of Everlene Balls, MD. Electronically Signed: Judithann Sauger, ED Scribe. 08/07/2015. 2:41 AM.      Chief Complaint  Patient presents with  . Abdominal Pain   The history is provided by the patient. No language interpreter was used.   HPI Comments: Jason Hill is a 75 y.o. male who presents to the Emergency Department complaining of partially resolved non-radiating constant moderately dull generalized abdominal pain onset last night. He explains that he went out to eat at [employer] where he had seafood and a tossed salad. He adds that his wife also had the seafood but she had coleslaw and does not have any symptoms. No alleviating factors noted. Pt has not taken any medications PTA. He denies any fever, chills, or n/v/d.    Past Medical History  Diagnosis Date  . Pneumonia    History reviewed. No pertinent past surgical history. No family history on file. Social History  Substance Use Topics  . Smoking status: Never Smoker   . Smokeless tobacco: None  . Alcohol Use: Yes    Review of Systems  Constitutional: Negative for fever and chills.  Gastrointestinal: Positive for abdominal pain. Negative for nausea, vomiting and diarrhea.   A complete 10 system review of systems was obtained and all systems are negative except as noted in the HPI and PMH.     Allergies  Review of patient's allergies indicates no known allergies.  Home Medications   Prior to Admission medications   Medication Sig Start Date End Date Taking? Authorizing Provider  benzonatate (TESSALON) 200 MG capsule Take 200 mg by mouth 3 (three) times daily as needed for cough.   Yes Historical Provider, MD  chlorpheniramine-HYDROcodone (TUSSIONEX) 10-8 MG/5ML SUER Take 5 mLs by mouth at bedtime.   Yes  Historical Provider, MD  mometasone-formoterol (DULERA) 100-5 MCG/ACT AERO Inhale 2 puffs into the lungs 2 (two) times daily.   Yes Historical Provider, MD   BP 141/73 mmHg  Pulse 84  Temp(Src) 97.7 F (36.5 C) (Oral)  Resp 20  SpO2 97% Physical Exam  Constitutional: He is oriented to person, place, and time. Vital signs are normal. He appears well-developed and well-nourished.  Non-toxic appearance. He does not appear ill. No distress.  HENT:  Head: Normocephalic and atraumatic.  Nose: Nose normal.  Mouth/Throat: Oropharynx is clear and moist. No oropharyngeal exudate.  Eyes: Conjunctivae and EOM are normal. Pupils are equal, round, and reactive to light. No scleral icterus.  Neck: Normal range of motion. Neck supple. No tracheal deviation, no edema, no erythema and normal range of motion present. No thyroid mass and no thyromegaly present.  Cardiovascular: Normal rate, regular rhythm, S1 normal, S2 normal, normal heart sounds, intact distal pulses and normal pulses.  Exam reveals no gallop and no friction rub.   No murmur heard. Pulmonary/Chest: Effort normal and breath sounds normal. No respiratory distress. He has no wheezes. He has no rhonchi. He has no rales.  Abdominal: Soft. Normal appearance and bowel sounds are normal. He exhibits no distension, no ascites and no mass. There is no hepatosplenomegaly. There is no tenderness. There is no rebound, no guarding and no CVA tenderness.  Musculoskeletal: Normal range of motion. He exhibits no edema or tenderness.  Lymphadenopathy:    He has no cervical adenopathy.  Neurological: He is alert and oriented to person, place, and time. He has normal strength. No cranial nerve deficit or sensory deficit.  Skin: Skin is warm, dry and intact. No petechiae and no rash noted. He is not diaphoretic. No erythema. No pallor.  Psychiatric: He has a normal mood and affect. His behavior is normal. Judgment normal.  Nursing note and vitals  reviewed.   ED Course  Procedures (including critical care time) DIAGNOSTIC STUDIES: Oxygen Saturation is 97% on RA, normal by my interpretation.    COORDINATION OF CARE: 2:32 AM- Pt advised of plan for treatment and pt agrees. Pt will receive chest x-ray and a CT scan of abdomen.    Labs Review Labs Reviewed  COMPREHENSIVE METABOLIC PANEL - Abnormal; Notable for the following:    Chloride 99 (*)    Glucose, Bld 150 (*)    GFR calc non Af Amer 56 (*)    All other components within normal limits  CBC - Abnormal; Notable for the following:    WBC 14.3 (*)    All other components within normal limits  LIPASE, BLOOD  URINALYSIS, ROUTINE W REFLEX MICROSCOPIC (NOT AT [facility])  TROPONIN I    Imaging Review Ct Abdomen Pelvis W Contrast  08/07/2015  CLINICAL DATA:  Diffuse abdominal pain, vomiting, possible food poisoning after eating salmon last night at restaurant. EXAM: CT ABDOMEN AND PELVIS WITH CONTRAST TECHNIQUE: Multidetector CT imaging of the abdomen and pelvis was performed using the standard protocol following bolus administration of intravenous contrast. CONTRAST:  167mL OMNIPAQUE IOHEXOL 300 MG/ML  SOLN COMPARISON:  None. FINDINGS: LUNG BASES: Fibrotic changes lung bases. Calcified granulomas and calcified may be subtle lymph nodes. Heart size is normal. Mild coronary artery calcifications. No pericardial effusion. SOLID ORGANS: Gallbladder wall thickening, pericholecystic fluid with multiple small gallstones. Focal fatty infiltration about the gallbladder fossa. Calcified splenic granulomas. Thickened adrenal glands can be seen with hyperplasia. The pancreas is unremarkable. GASTROINTESTINAL TRACT: Moderate to severe sigmoid diverticulosis or short segment of proximal sigmoid bowel eccentric wall thickening and mild inflammation. The stomach, small bowel are normal in course and caliber without inflammatory changes. Normal appendix. KIDNEYS/ URINARY TRACT: Kidneys are orthotopic,  demonstrating symmetric enhancement. 8 mm RIGHT lower pole, 4 mm RIGHT interpolar nephrolithiasis. No hydronephrosis or solid renal masses. The unopacified ureters are normal in course and caliber. Delayed imaging through the kidneys demonstrates symmetric prompt contrast excretion within the proximal urinary collecting system. Urinary bladder is partially distended and unremarkable. PERITONEUM/RETROPERITONEUM: Aortoiliac vessels are normal in course and caliber moderate to severe intimal thickening calcific atherosclerosis. No lymphadenopathy by CT size criteria. Small scattered calcified lymph nodes. Prostate is mildly enlarged. No intraperitoneal free fluid nor free air. SOFT TISSUE/OSSEOUS STRUCTURES: Non-suspicious. Small bilateral fat containing inguinal hernias. Severe L5-S1 disc height loss, vacuum disc and endplate spurring consistent with degenerative disc resulting in moderate to severe LEFT neural foraminal narrowing. IMPRESSION: Acute cholecystitis. Diverticulosis and short segment of mild diverticulitis which may be resolving. Given eccentric focal wall thickening, underlying mass not excluded and follow-up colonoscopy is recommended. Electronically Signed   By: Elon Alas M.D.   On: 08/07/2015 03:27     Everlene Balls, MD has personally reviewed and evaluated these images and lab results as part of his medical decision-making.   EKG Interpretation   Date/Time:  Friday August 06 2015 23:41:52 EST Ventricular Rate:  63 PR Interval:  154 QRS Duration: 84 QT Interval:  418 QTC Calculation: 427 R Axis:   75 Text  Interpretation:  Normal sinus rhythm Normal ECG No significant change  since last tracing Confirmed by Glynn Octave [phone]) on 08/07/2015  2:38:50 AM      MDM   Final diagnoses:  None    Patient presents to the ED for abdominal pain occuring with sudden onset.  It was after a meal but he denies any vomiting or diarrhea which is unusual.  He still has some  tenderness on exam, will obtain CT scan for evaluation.    CT reveals acute cholecystitis.  Patient given zosyn due to age and will be admitted to surgery for further care.   I personally performed the services described in this documentation, which was scribed in my presence. The recorded information has been reviewed and is accurate.      Everlene Balls, MD 08/07/15 [phone]

## 2015-08-07 NOTE — Anesthesia Preprocedure Evaluation (Addendum)
Anesthesia Evaluation  Patient identified by MRN, date of birth, ID band Patient awake    Reviewed: Allergy & Precautions, NPO status , Patient's Chart, lab work & pertinent test results  Airway Mallampati: II  TM Distance: >3 FB Neck ROM: full    Dental  (+) Teeth Intact, Dental Advisory Given   Pulmonary neg pulmonary ROS,  Silicosis.  Denies DOE   breath sounds clear to auscultation       Cardiovascular negative cardio ROS   Rhythm:regular Rate:Normal     Neuro/Psych    GI/Hepatic Acute cholecystitis.   Endo/Other    Renal/GU      Musculoskeletal   Abdominal   Peds  Hematology   Anesthesia Other Findings   Reproductive/Obstetrics                            Anesthesia Physical Anesthesia Plan  ASA: II  Anesthesia Plan: General   Post-op Pain Management:    Induction: Intravenous  Airway Management Planned: Oral ETT  Additional Equipment:   Intra-op Plan:   Post-operative Plan: Extubation in OR  Informed Consent: I have reviewed the patients History and Physical, chart, labs and discussed the procedure including the risks, benefits and alternatives for the proposed anesthesia with the patient or authorized representative who has indicated his/her understanding and acceptance.     Plan Discussed with: CRNA, Anesthesiologist and Surgeon  Anesthesia Plan Comments:         Anesthesia Quick Evaluation

## 2015-08-07 NOTE — H&P (Signed)
Jason Hill is an 75 y.o. male.   Chief Complaint: abdominal pain HPI: 75 yo male with 1 day abdominal pain. Pt associates this pain with salad that he had 2 days ago. He has not had similar pain in the past.   Past Medical History  Diagnosis Date  . Pneumonia     History reviewed. No pertinent past surgical history.  No family history on file. Social History:  reports that he has never smoked. He does not have any smokeless tobacco history on file. He reports that he drinks alcohol. His drug history is not on file.  Allergies: No Known Allergies   (Not in a hospital admission)  Results for orders placed or performed during the hospital encounter of 08/07/15 (from the past 48 hour(s))  Lipase, blood     Status: None   Collection Time: 08/07/15 12:10 AM  Result Value Ref Range   Lipase 43 11 - 51 U/L  Comprehensive metabolic panel     Status: Abnormal   Collection Time: 08/07/15 12:10 AM  Result Value Ref Range   Sodium 136 135 - 145 mmol/L   Potassium 4.9 3.5 - 5.1 mmol/L   Chloride 99 (L) 101 - 111 mmol/L   CO2 27 22 - 32 mmol/L   Glucose, Bld 150 (H) 65 - 99 mg/dL   BUN 17 6 - 20 mg/dL   Creatinine, Ser 1.23 0.61 - 1.24 mg/dL   Calcium 9.7 8.9 - 10.3 mg/dL   Total Protein 7.0 6.5 - 8.1 g/dL   Albumin 3.7 3.5 - 5.0 g/dL   AST 24 15 - 41 U/L   ALT 23 17 - 63 U/L   Alkaline Phosphatase 44 38 - 126 U/L   Total Bilirubin 0.7 0.3 - 1.2 mg/dL   GFR calc non Af Amer 56 (L) >60 mL/min   GFR calc Af Amer >60 >60 mL/min    Comment: (NOTE) The eGFR has been calculated using the CKD EPI equation. This calculation has not been validated in all clinical situations. eGFR's persistently <60 mL/min signify possible Chronic Kidney Disease.    Anion gap 10 5 - 15  CBC     Status: Abnormal   Collection Time: 08/07/15 12:10 AM  Result Value Ref Range   WBC 14.3 (H) 4.0 - 10.5 K/uL   RBC 5.02 4.22 - 5.81 MIL/uL   Hemoglobin 15.0 13.0 - 17.0 g/dL   HCT 43.8 39.0 - 52.0 %   MCV  87.3 78.0 - 100.0 fL   MCH 29.9 26.0 - 34.0 pg   MCHC 34.2 30.0 - 36.0 g/dL   RDW 14.5 11.5 - 15.5 %   Platelets 220 150 - 400 K/uL  Troponin I     Status: None   Collection Time: 08/07/15 12:10 AM  Result Value Ref Range   Troponin I <0.03 <0.031 ng/mL    Comment:        NO INDICATION OF MYOCARDIAL INJURY.   Urinalysis, Routine w reflex microscopic (not at [facility])     Status: None   Collection Time: 08/07/15 12:13 AM  Result Value Ref Range   Color, Urine YELLOW YELLOW   APPearance CLEAR CLEAR   Specific Gravity, Urine 1.019 1.005 - 1.030   pH 7.5 5.0 - 8.0   Glucose, UA NEGATIVE NEGATIVE mg/dL   Hgb urine dipstick NEGATIVE NEGATIVE   Bilirubin Urine NEGATIVE NEGATIVE   Ketones, ur NEGATIVE NEGATIVE mg/dL   Protein, ur NEGATIVE NEGATIVE mg/dL   Nitrite NEGATIVE NEGATIVE  Leukocytes, UA NEGATIVE NEGATIVE    Comment: MICROSCOPIC NOT DONE ON URINES WITH NEGATIVE PROTEIN, BLOOD, LEUKOCYTES, NITRITE, OR GLUCOSE <1000 mg/dL.   Ct Abdomen Pelvis W Contrast  08/07/2015  CLINICAL DATA:  Diffuse abdominal pain, vomiting, possible food poisoning after eating salmon last night at restaurant. EXAM: CT ABDOMEN AND PELVIS WITH CONTRAST TECHNIQUE: Multidetector CT imaging of the abdomen and pelvis was performed using the standard protocol following bolus administration of intravenous contrast. CONTRAST:  123m OMNIPAQUE IOHEXOL 300 MG/ML  SOLN COMPARISON:  None. FINDINGS: LUNG BASES: Fibrotic changes lung bases. Calcified granulomas and calcified may be subtle lymph nodes. Heart size is normal. Mild coronary artery calcifications. No pericardial effusion. SOLID ORGANS: Gallbladder wall thickening, pericholecystic fluid with multiple small gallstones. Focal fatty infiltration about the gallbladder fossa. Calcified splenic granulomas. Thickened adrenal glands can be seen with hyperplasia. The pancreas is unremarkable. GASTROINTESTINAL TRACT: Moderate to severe sigmoid diverticulosis or short segment of  proximal sigmoid bowel eccentric wall thickening and mild inflammation. The stomach, small bowel are normal in course and caliber without inflammatory changes. Normal appendix. KIDNEYS/ URINARY TRACT: Kidneys are orthotopic, demonstrating symmetric enhancement. 8 mm RIGHT lower pole, 4 mm RIGHT interpolar nephrolithiasis. No hydronephrosis or solid renal masses. The unopacified ureters are normal in course and caliber. Delayed imaging through the kidneys demonstrates symmetric prompt contrast excretion within the proximal urinary collecting system. Urinary bladder is partially distended and unremarkable. PERITONEUM/RETROPERITONEUM: Aortoiliac vessels are normal in course and caliber moderate to severe intimal thickening calcific atherosclerosis. No lymphadenopathy by CT size criteria. Small scattered calcified lymph nodes. Prostate is mildly enlarged. No intraperitoneal free fluid nor free air. SOFT TISSUE/OSSEOUS STRUCTURES: Non-suspicious. Small bilateral fat containing inguinal hernias. Severe L5-S1 disc height loss, vacuum disc and endplate spurring consistent with degenerative disc resulting in moderate to severe LEFT neural foraminal narrowing. IMPRESSION: Acute cholecystitis. Diverticulosis and short segment of mild diverticulitis which may be resolving. Given eccentric focal wall thickening, underlying mass not excluded and follow-up colonoscopy is recommended. Electronically Signed   By: CElon AlasM.D.   On: 08/07/2015 03:27    Review of Systems  Constitutional: Negative for fever and chills.  HENT: Positive for congestion and sore throat. Negative for hearing loss.   Eyes: Negative for blurred vision and double vision.  Respiratory: Positive for cough and wheezing. Negative for hemoptysis.   Cardiovascular: Negative for chest pain and palpitations.  Gastrointestinal: Positive for abdominal pain. Negative for nausea and vomiting.  Genitourinary: Negative for dysuria and urgency.   Musculoskeletal: Negative for myalgias and neck pain.  Skin: Negative for itching and rash.  Neurological: Negative for dizziness, tingling and headaches.  Endo/Heme/Allergies: Does not bruise/bleed easily.  Psychiatric/Behavioral: Negative for depression and suicidal ideas.    Blood pressure 131/68, pulse 88, temperature 97.7 F (36.5 C), temperature source Oral, resp. rate 20, SpO2 95 %. Physical Exam  Constitutional: He is oriented to person, place, and time. He appears well-developed and well-nourished.  HENT:  Head: Normocephalic and atraumatic.  Eyes: Conjunctivae are normal. Pupils are equal, round, and reactive to light.  Neck: Normal range of motion. Neck supple.  Cardiovascular: Normal rate and regular rhythm.   Respiratory: Breath sounds normal.  GI: Soft. He exhibits no distension and no mass. There is tenderness. There is no rebound.  RUQ tenderness  Musculoskeletal: He exhibits no edema or tenderness.  Neurological: He is alert and oriented to person, place, and time.  Skin: Skin is warm and dry.  Psychiatric: He has a normal mood  and affect. His behavior is normal.     Assessment/Plan 75 yo male with Acute cholecystitis  -broad abx -plan for lap chole today -NPO -pain control  Mickeal Skinner, MD 08/07/2015, 5:27 AM

## 2015-08-08 LAB — COMPREHENSIVE METABOLIC PANEL
ALBUMIN: 2.9 g/dL — AB (ref 3.5–5.0)
ALK PHOS: 32 U/L — AB (ref 38–126)
ALT: 53 U/L (ref 17–63)
ANION GAP: 7 (ref 5–15)
AST: 57 U/L — AB (ref 15–41)
BILIRUBIN TOTAL: 0.8 mg/dL (ref 0.3–1.2)
BUN: 13 mg/dL (ref 6–20)
CO2: 27 mmol/L (ref 22–32)
Calcium: 8.5 mg/dL — ABNORMAL LOW (ref 8.9–10.3)
Chloride: 103 mmol/L (ref 101–111)
Creatinine, Ser: 1.19 mg/dL (ref 0.61–1.24)
GFR calc Af Amer: >60 mL/min (ref >60)
GFR, EST NON AFRICAN AMERICAN: 58 mL/min — AB (ref >60)
GLUCOSE: 139 mg/dL — AB (ref 65–99)
Potassium: 5.1 mmol/L (ref 3.5–5.1)
Sodium: 137 mmol/L (ref 135–145)
TOTAL PROTEIN: 6 g/dL — AB (ref 6.5–8.1)

## 2015-08-08 LAB — CBC
HCT: 34.9 % — ABNORMAL LOW (ref 39.0–52.0)
Hemoglobin: 11.8 g/dL — ABNORMAL LOW (ref 13.0–17.0)
MCH: 29.5 pg (ref 26.0–34.0)
MCHC: 33.8 g/dL (ref 30.0–36.0)
MCV: 87.3 fL (ref 78.0–100.0)
Platelets: 178 10*3/uL (ref 150–400)
RBC: 4 MIL/uL — ABNORMAL LOW (ref 4.22–5.81)
RDW: 14.8 % (ref 11.5–15.5)
WBC: 16 10*3/uL — AB (ref 4.0–10.5)

## 2015-08-08 MED ORDER — MENTHOL 3 MG MT LOZG
1.0000 | LOZENGE | OROMUCOSAL | Status: DC | PRN
  Administered 2015-08-08: 3 mg via ORAL
  Filled 2015-08-08: qty 9

## 2015-08-08 NOTE — Progress Notes (Signed)
1 Day Post-Op lap chole Subjective: Pt feels good.  No nausea.  Tolerating a diet.  C/O some R sided pain.  Objective: Vital signs in last 24 hours: Temp:  [97.7 F (36.5 C)-99 F (37.2 C)] 98.6 F (37 C) (02/05 0841) Pulse Rate:  [81-118] 93 (02/05 0841) Resp:  [15-21] 16 (02/05 0841) BP: (102-150)/(46-75) 115/59 mmHg (02/05 0841) SpO2:  [92 %-100 %] 100 % (02/05 0845) Weight:  [92.3 kg (203 lb 7.8 oz)] 92.3 kg (203 lb 7.8 oz) (02/04 1132)   Intake/Output from previous day: 02/04 0701 - 02/05 0700 In: 4255 [P.O.:1560; I.V.:2245; IV Piggyback:50] Out: [id number] [Urine:3775; Drains:62; Blood:1800] Intake/Output this shift: Total I/O In: 480 [P.O.:480] Out: 25 [Drains:25]   General appearance: alert and cooperative GI: normal findings: soft, non-tender  Incision: no significant drainage, no significant erythema JP: SS drainage  Lab Results:   Recent Labs  08/07/15 1550 08/08/15 0601  WBC 12.2* 16.0*  HGB 12.9* 11.8*  HCT 37.9* 34.9*  PLT 186 178   BMET  Recent Labs  08/07/15 0010 08/07/15 0950 08/08/15 0601  NA 136 136 137  K 4.9 4.5 5.1  CL 99*  --  103  CO2 27  --  27  GLUCOSE 150* 173* 139*  BUN 17  --  13  CREATININE 1.23  --  1.19  CALCIUM 9.7  --  8.5*   PT/INR No results for input(s): LABPROT, INR in the last 72 hours. ABG No results for input(s): PHART, HCO3 in the last 72 hours.  Invalid input(s): PCO2, PO2  MEDS, Scheduled . chlorpheniramine-HYDROcodone  5 mL Oral QHS  . cholecalciferol  1,000 Units Oral Daily  . fluticasone  1-2 spray Each Nare Daily  . loratadine  10 mg Oral Daily  . mometasone-formoterol  2 puff Inhalation BID  . piperacillin-tazobactam (ZOSYN)  IV  3.375 g Intravenous 3 times per day    Studies/Results: Ct Abdomen Pelvis W Contrast  08/07/2015  CLINICAL DATA:  Diffuse abdominal pain, vomiting, possible food poisoning after eating salmon last night at restaurant. EXAM: CT ABDOMEN AND PELVIS WITH CONTRAST TECHNIQUE:  Multidetector CT imaging of the abdomen and pelvis was performed using the standard protocol following bolus administration of intravenous contrast. CONTRAST:  132mL OMNIPAQUE IOHEXOL 300 MG/ML  SOLN COMPARISON:  None. FINDINGS: LUNG BASES: Fibrotic changes lung bases. Calcified granulomas and calcified may be subtle lymph nodes. Heart size is normal. Mild coronary artery calcifications. No pericardial effusion. SOLID ORGANS: Gallbladder wall thickening, pericholecystic fluid with multiple small gallstones. Focal fatty infiltration about the gallbladder fossa. Calcified splenic granulomas. Thickened adrenal glands can be seen with hyperplasia. The pancreas is unremarkable. GASTROINTESTINAL TRACT: Moderate to severe sigmoid diverticulosis or short segment of proximal sigmoid bowel eccentric wall thickening and mild inflammation. The stomach, small bowel are normal in course and caliber without inflammatory changes. Normal appendix. KIDNEYS/ URINARY TRACT: Kidneys are orthotopic, demonstrating symmetric enhancement. 8 mm RIGHT lower pole, 4 mm RIGHT interpolar nephrolithiasis. No hydronephrosis or solid renal masses. The unopacified ureters are normal in course and caliber. Delayed imaging through the kidneys demonstrates symmetric prompt contrast excretion within the proximal urinary collecting system. Urinary bladder is partially distended and unremarkable. PERITONEUM/RETROPERITONEUM: Aortoiliac vessels are normal in course and caliber moderate to severe intimal thickening calcific atherosclerosis. No lymphadenopathy by CT size criteria. Small scattered calcified lymph nodes. Prostate is mildly enlarged. No intraperitoneal free fluid nor free air. SOFT TISSUE/OSSEOUS STRUCTURES: Non-suspicious. Small bilateral fat containing inguinal hernias. Severe L5-S1 disc height loss, vacuum  disc and endplate spurring consistent with degenerative disc resulting in moderate to severe LEFT neural foraminal narrowing. IMPRESSION:  Acute cholecystitis. Diverticulosis and short segment of mild diverticulitis which may be resolving. Given eccentric focal wall thickening, underlying mass not excluded and follow-up colonoscopy is recommended. Electronically Signed   By: Elon Alas M.D.   On: 08/07/2015 03:27    Assessment: s/p Procedure(s): LAPAROSCOPIC CHOLECYSTECTOMY WITH POSSIBLE INTRAOPERATIVE CHOLANGIOGRAM Patient Active Problem List   Diagnosis Date Noted  . Acute cholecystitis 08/07/2015  . DEVIATED NASAL SEPTUM 08/26/2008  . ALLERGIC RHINITIS 08/26/2008  . SILICOSIS 99991111  . COUGH 08/26/2008    Expected post op course, difficult surgery  Plan: Cont diet as tolerated  PO narcotics Ambulate Cont IV abx       .Rosario Adie, [facility], [place]   08/08/2015 9:51 AM

## 2015-08-09 ENCOUNTER — Encounter (HOSPITAL_COMMUNITY): Payer: Self-pay | Admitting: General Practice

## 2015-08-09 ENCOUNTER — Inpatient Hospital Stay (HOSPITAL_COMMUNITY): Payer: Commercial Managed Care - HMO

## 2015-08-09 DIAGNOSIS — K810 Acute cholecystitis: Secondary | ICD-10-CM | POA: Diagnosis present

## 2015-08-09 DIAGNOSIS — J628 Pneumoconiosis due to other dust containing silica: Secondary | ICD-10-CM | POA: Diagnosis present

## 2015-08-09 DIAGNOSIS — K567 Ileus, unspecified: Secondary | ICD-10-CM | POA: Diagnosis not present

## 2015-08-09 DIAGNOSIS — R1084 Generalized abdominal pain: Secondary | ICD-10-CM | POA: Diagnosis present

## 2015-08-09 HISTORY — PX: CHOLECYSTECTOMY: SHX55

## 2015-08-09 LAB — BASIC METABOLIC PANEL
ANION GAP: 11 (ref 5–15)
BUN: 17 mg/dL (ref 6–20)
CALCIUM: 9.1 mg/dL (ref 8.9–10.3)
CO2: 29 mmol/L (ref 22–32)
CREATININE: 1.44 mg/dL — AB (ref 0.61–1.24)
Chloride: 99 mmol/L — ABNORMAL LOW (ref 101–111)
GFR calc non Af Amer: 46 mL/min — ABNORMAL LOW (ref >60)
GFR, EST AFRICAN AMERICAN: 54 mL/min — AB (ref >60)
Glucose, Bld: 102 mg/dL — ABNORMAL HIGH (ref 65–99)
Potassium: 4.3 mmol/L (ref 3.5–5.1)
SODIUM: 139 mmol/L (ref 135–145)

## 2015-08-09 LAB — CBC
HCT: 35.6 % — ABNORMAL LOW (ref 39.0–52.0)
HEMOGLOBIN: 12 g/dL — AB (ref 13.0–17.0)
MCH: 29.9 pg (ref 26.0–34.0)
MCHC: 33.7 g/dL (ref 30.0–36.0)
MCV: 88.6 fL (ref 78.0–100.0)
PLATELETS: 201 10*3/uL (ref 150–400)
RBC: 4.02 MIL/uL — AB (ref 4.22–5.81)
RDW: 14.9 % (ref 11.5–15.5)
WBC: 12.8 10*3/uL — AB (ref 4.0–10.5)

## 2015-08-09 MED ORDER — IOHEXOL 300 MG/ML  SOLN
75.0000 mL | Freq: Once | INTRAMUSCULAR | Status: AC | PRN
  Administered 2015-08-09: 75 mL via INTRAVENOUS

## 2015-08-09 MED ORDER — ENOXAPARIN SODIUM 40 MG/0.4ML ~~LOC~~ SOLN
40.0000 mg | SUBCUTANEOUS | Status: DC
  Administered 2015-08-09: 40 mg via SUBCUTANEOUS
  Filled 2015-08-09: qty 0.4

## 2015-08-09 NOTE — Progress Notes (Signed)
[facility] Progress Note  2 Days Post-Op  Subjective: Pt doing well, no N/V, tolerating diet.  Pain well controlled, just a little sore.  Mild distension, but passing flatus.  Asking whether a chest CT can be done here that was scheduled for Wednesday to recheck his silicosis.  Cough improved, mild SOB.    Objective: Vital signs in last 24 hours: Temp:  [98.5 F (36.9 C)-98.9 F (37.2 C)] 98.5 F (36.9 C) (02/06 0459) Pulse Rate:  [89-101] 89 (02/06 0459) Resp:  [16-18] 18 (02/06 0459) BP: (127-136)/(68-69) 136/69 mmHg (02/06 0459) SpO2:  [96 %-99 %] 96 % (02/06 0918)    Intake/Output from previous day: 02/05 0701 - 02/06 0700 In: 1000 [P.O.:800; IV Piggyback:200] Out: 2000 [Urine:1925; Drains:75] Intake/Output this shift:    PE: Gen:  Alert, NAD, pleasant Abd: Soft, mild distension, mild tenderness, +BS, no HSM, incisions C/D/I, drain with minimal sanguinous drainage (70mL/24hr)   Lab Results:   Recent Labs  08/08/15 0601 08/09/15 0938  WBC 16.0* 12.8*  HGB 11.8* 12.0*  HCT 34.9* 35.6*  PLT 178 201   BMET  Recent Labs  08/08/15 0601 08/09/15 0938  NA 137 139  K 5.1 4.3  CL 103 99*  CO2 27 29  GLUCOSE 139* 102*  BUN 13 17  CREATININE 1.19 1.44*  CALCIUM 8.5* 9.1   PT/INR No results for input(s): LABPROT, INR in the last 72 hours. CMP     Component Value Date/Time   NA 139 08/09/2015 0938   K 4.3 08/09/2015 0938   CL 99* 08/09/2015 0938   CO2 29 08/09/2015 0938   GLUCOSE 102* 08/09/2015 0938   BUN 17 08/09/2015 0938   CREATININE 1.44* 08/09/2015 0938   CALCIUM 9.1 08/09/2015 0938   PROT 6.0* 08/08/2015 0601   ALBUMIN 2.9* 08/08/2015 0601   AST 57* 08/08/2015 0601   ALT 53 08/08/2015 0601   ALKPHOS 32* 08/08/2015 0601   BILITOT 0.8 08/08/2015 0601   GFRNONAA 46* 08/09/2015 0938   GFRAA 54* 08/09/2015 0938   Lipase     Component Value Date/Time   LIPASE 43 08/07/2015 0010       Studies/Results: No results  found.  Anti-infectives: Anti-infectives    Start     Dose/Rate Route Frequency Ordered Stop   08/07/15 1200  piperacillin-tazobactam (ZOSYN) IVPB 3.375 g     3.375 g 12.5 mL/hr over 240 Minutes Intravenous 3 times per day 08/07/15 1140     08/07/15 1145  piperacillin-tazobactam (ZOSYN) IVPB 3.375 g  Status:  Discontinued     3.375 g 12.5 mL/hr over 240 Minutes Intravenous 3 times per day 08/07/15 1141 08/07/15 1202   08/07/15 0345  piperacillin-tazobactam (ZOSYN) IVPB 3.375 g     3.375 g 100 mL/hr over 30 Minutes Intravenous  Once 08/07/15 0340 08/07/15 0416       Assessment/Plan Acute cholecystitis POD #2 s/p difficult/bloody Lap chole -Soft diet -On Zosyn Day #3.  Plan for ABX for 1 week upon discharge -WBC down to 12.8, recheck tomorrow -Hgb improving after surgery, drain output sanguinous with some clots 24mL/24hr, will likely stay at discharge -Ambulate and IS -SCD's and can likely resume lovenox today  Chronic silicosis -Dr. Lysle Rubens, his PCP, had ordered a CT of his chest with contrast to be done Wednesday due to 5 weeks of respiratory problems.  He has let the office know he likely wont make it since he's recovering from having his gallbladder out.  I have ordered it to be  done while he's here.    Disp - plan for tomorrow if WBC improved and pain well controlled      Nat Christen 08/09/2015, 10:35 AM Pager: [phone]

## 2015-08-09 NOTE — Progress Notes (Signed)
IV access lost this AM. Patient requested that we wait "to stick him again" until Doctors arrive and tell him if he will be discharged today. 6 AM dose of Zosyn held. Will pass along to morning Nurse and continue to monitor. Jimmie Molly, RN

## 2015-08-10 ENCOUNTER — Encounter (HOSPITAL_COMMUNITY): Payer: Self-pay | Admitting: Surgery

## 2015-08-10 LAB — CBC
HEMATOCRIT: 36.6 % — AB (ref 39.0–52.0)
HEMOGLOBIN: 11.8 g/dL — AB (ref 13.0–17.0)
MCH: 28.2 pg (ref 26.0–34.0)
MCHC: 32.2 g/dL (ref 30.0–36.0)
MCV: 87.6 fL (ref 78.0–100.0)
Platelets: 226 10*3/uL (ref 150–400)
RBC: 4.18 MIL/uL — ABNORMAL LOW (ref 4.22–5.81)
RDW: 14.8 % (ref 11.5–15.5)
WBC: 12 10*3/uL — ABNORMAL HIGH (ref 4.0–10.5)

## 2015-08-10 MED ORDER — ACETAMINOPHEN 325 MG PO TABS: 650.0000 mg | ORAL_TABLET | Freq: Four times a day (QID) | ORAL | Status: AC | PRN

## 2015-08-10 MED ORDER — POLYETHYLENE GLYCOL 3350 17 GM/SCOOP PO POWD: 8.5000 g | Freq: Every day | ORAL | Status: AC | PRN

## 2015-08-10 MED ORDER — AMOXICILLIN-POT CLAVULANATE 875-125 MG PO TABS: 1.0000 | ORAL_TABLET | Freq: Two times a day (BID) | ORAL | Status: DC

## 2015-08-10 MED ORDER — OXYCODONE HCL 5 MG PO TABS: 5.0000 mg | ORAL_TABLET | Freq: Four times a day (QID) | ORAL | Status: DC | PRN

## 2015-08-10 MED ORDER — DOCUSATE SODIUM 100 MG PO CAPS: 100.0000 mg | ORAL_CAPSULE | Freq: Two times a day (BID) | ORAL | Status: AC

## 2015-08-10 NOTE — Progress Notes (Signed)
Patient discharged to home with instructions, wife at bedside.

## 2015-08-10 NOTE — Discharge Summary (Signed)
[facility] Discharge Summary   Patient ID: Jason Hill MRN: [id number] DOB/AGE: 1940-10-10 75 y.o.  Admit date: 08/07/2015 Discharge date: 08/10/2015  Admitting Diagnosis: Acute cholecystitis  Discharge Diagnosis Patient Active Problem List   Diagnosis Date Noted  . Acute cholecystitis 08/07/2015  . DEVIATED NASAL SEPTUM 08/26/2008  . ALLERGIC RHINITIS 08/26/2008  . SILICOSIS [id number]  . COUGH 08/26/2008    Consultants None  Imaging: Ct Chest W Contrast  08/09/2015  CLINICAL DATA:  History of silicosis with 5 weeks of respiratory difficulty. Postop day 2 for cholecystectomy. EXAM: CT CHEST WITH CONTRAST TECHNIQUE: Multidetector CT imaging of the chest was performed during intravenous contrast administration. CONTRAST:  14mL OMNIPAQUE IOHEXOL 300 MG/ML  SOLN COMPARISON:  Plain film 08/03/2015 chest CT 07/02/2010. FINDINGS: Mediastinum/Nodes: Aortic and branch vessel atherosclerosis. Normal heart size, without pericardial effusion. LAD coronary artery atherosclerosis. Calcified mediastinal and hilar nodes are similar to on the prior exam. Lungs/Pleura: No pleural fluid. upper lobe predominant architectural distortion with hilar retraction superiorly again identified. Consolidative masslike opacities within the central right upper lobes are decreased and consistent with massive progressive fibrosis. Areas of more ill-defined interstitial opacity along the peribronchovascular interstitium are also decreased. no superimposed consolidation or dominant lung mass. Upper abdomen: surgical drain in the right upper quadrant. Cholecystectomy. Ill-defined gas and and edema/ fluid in the operative bed likely relates to surgical packing material (when correlated with operative note). Normal imaged portions of the liver, stomach, pancreas, adrenal glands, kidneys. Old granulomatous disease in the spleen. Calcified nodes in the upper abdomen. Postoperative gas within the upper abdominal  and lower right chest wall. Musculoskeletal: No acute osseous abnormality. IMPRESSION: 1. Slight decrease in pulmonary parenchymal silicosis when compared to 07/01/10. 2. No superimposed explanation for shortness of Breath. 3.  Atherosclerosis, including within the coronary arteries. 4. Postoperative changes of recent cholecystectomy, as detailed above. Electronically Signed   By: Abigail Miyamoto M.D.   On: 08/09/2015 17:53    Procedures Dr. Ninfa Linden (08/07/15) - Laparoscopic Cholecystectomy without [facility]  Hospital Course:  75 yo male with h/o silicosis with 1 day abdominal pain. Pt associates this pain with salad that he had 2 days ago. He has not had similar pain in the past.  He was found to have acute cholecystitis.  He was started on IV antibiotics.    Patient was admitted and underwent procedure listed above.  He lost quite a bit of blood and it was in the setting of a very friable gallbladder.  The back wall of the gallbladder had to be left in place.  A drain was left in place.  Tolerated procedure well and was transferred to the floor.  He experienced a post-operative ileus.  Blood counts stabilized and leukocytosis improved significantly.  He has had down trending of his WBC for 2 days in a row and he is afebrile.  His drain originally drained bloody drainage and clots, but now is much more serosanguinous.  Drain output is at 21mL/24hr and still to high to discontinue prior to discharge.  We will get him a nurse only visit to have the drain removed next week.  Diet was advanced as tolerated.  We got a CT scan of his chest while he was here.  This was ordered by his PCP Dr. Lysle Rubens to check the progression of his silicosis.  The CT actually showed improvement, no signs of pneumonia, and normal post-op changes to the gallbladder fossa.  We encouraged a stool softener and miralax as needed.  On POD #3, the patient was voiding well, tolerating diet, ambulating well, pain well controlled, vital signs stable,  incisions c/d/i, drain left in place,and felt stable for discharge home.  Patient will follow up in our office in 1 week for drain removal and in 3 weeks for a post-surgery check.  He knows to call with questions or concerns.  He will call to confirm appointment date/time for the drain removal.  He has a physical planned with Dr. Lysle Rubens in 2 weeks and he will get his labs check then one more time to make sure his WBC and Hgb has improved.     Physical Exam: General:  Alert, NAD, pleasant, comfortable Abd:  Soft, ND, mild tenderness, incisions C/D/I, drain with serosanguinous drainage (29mL/24hr)    Medication List    TAKE these medications        acetaminophen 325 MG tablet  Commonly known as:  TYLENOL  Take 2 tablets (650 mg total) by mouth every 6 (six) hours as needed for mild pain (or temp > 100).     amoxicillin-clavulanate 875-125 MG tablet  Commonly known as:  AUGMENTIN  Take 1 tablet by mouth 2 (two) times daily.     benzonatate 200 MG capsule  Commonly known as:  TESSALON  Take 200 mg by mouth 3 (three) times daily as needed for cough.     chlorpheniramine-HYDROcodone 10-8 MG/5ML Suer  Commonly known as:  TUSSIONEX  Take 5 mLs by mouth at bedtime.     cholecalciferol 1000 units tablet  Commonly known as:  VITAMIN D  Take 1,000 Units by mouth daily.     docusate sodium 100 MG capsule  Commonly known as:  COLACE  Take 1 capsule (100 mg total) by mouth 2 (two) times daily.     fexofenadine 180 MG tablet  Commonly known as:  ALLEGRA  Take 180 mg by mouth daily.     fluticasone 50 MCG/ACT nasal spray  Commonly known as:  FLONASE  Place 1-2 sprays into both nostrils daily.     mometasone-formoterol 100-5 MCG/ACT Aero  Commonly known as:  DULERA  Inhale 2 puffs into the lungs 2 (two) times daily.     oxyCODONE 5 MG immediate release tablet  Commonly known as:  Oxy IR/ROXICODONE  Take 1-2 tablets (5-10 mg total) by mouth every 6 (six) hours as needed for moderate  pain.     polyethylene glycol powder powder  Commonly known as:  MIRALAX  Take 8.5-34 g by mouth daily as needed for mild constipation or moderate constipation. To correct constipation.  Adjust dose over 1-2 months.  Goal = ~1 bowel movement / day         Follow-up Information    Follow up with [facility]. Go on 09/01/2015.   Specialty:  General Surgery   Why:  For post-operation check. Your appointment is at 9:40am, please arrive at least 50min before your appointment to complete your check in paperwork.  If you are unable to arrive 68min prior to your appointment time we may have to cancel or reschedule you.   Contact information:   [address] [place] [phone]      Follow up with [facility] [place]. Go on 08/17/2015.   Specialty:  General Surgery   Why:  Call to verify appointment date and time for consideration of drain removal with our [facility] nurses.     Contact information:   [address]  [place] [phone]      Go to Wenda Low, MD.   Specialty:  Internal Medicine   Why:  please remember to have Dr. Lysle Rubens check your CBC to make sure your WBC has improved.  Remember to review your CT chest with him.    Contact information:   [address] [place] [address] [phone]       Signed: Vaughan Basta [facility] [phone]  08/10/2015, 10:06 AM

## 2015-08-10 NOTE — Discharge Instructions (Signed)
Your appointment is at 9:40am, please arrive at least 45min before your appointment to complete your check in paperwork.  If you are unable to arrive 69min prior to your appointment time we may have to cancel or reschedule you.  LAPAROSCOPIC SURGERY: POST OP INSTRUCTIONS  1. DIET: Follow a light bland diet the first 24 hours after arrival home, such as soup, liquids, crackers, etc. Be sure to include lots of fluids daily. Avoid fast food or heavy meals as your are more likely to get nauseated. Eat a low fat the next few days after surgery.  2. Take your usually prescribed home medications unless otherwise directed. 3. PAIN CONTROL:  1. Pain is best controlled by a usual combination of three different methods TOGETHER:  1. Ice/Heat 2. Over the counter pain medication 3. Prescription pain medication 2. Most patients will experience some swelling and bruising around the incisions. Ice packs or heating pads (30-60 minutes up to 6 times a day) will help. Use ice for the first few days to help decrease swelling and bruising, then switch to heat to help relax tight/sore spots and speed recovery. Some people prefer to use ice alone, heat alone, alternating between ice & heat. Experiment to what works for you. Swelling and bruising can take several weeks to resolve.  3. It is helpful to take an over-the-counter pain medication regularly for the first few weeks. Choose one of the following that works best for you:  1. Naproxen (Aleve, etc) Two 220mg  tabs twice a day 2. Ibuprofen (Advil, etc) Three 200mg  tabs four times a day (every meal & bedtime) 3. Acetaminophen (Tylenol, etc) 500-650mg  four times a day (every meal & bedtime) 4. A prescription for pain medication (such as oxycodone, hydrocodone, etc) should be given to you upon discharge. Take your pain medication as prescribed.  1. If you are having problems/concerns with the prescription medicine (does not control pain, nausea, vomiting, rash, itching,  etc), please call us [phone] to see if we need to switch you to a different pain medicine that will work better for you and/or control your side effect better. 2. If you need a refill on your pain medication, please contact your pharmacy. They will contact our office to request authorization. Prescriptions will not be filled after 5 pm or on week-ends. 4. Avoid getting constipated. Between the surgery and the pain medications, it is common to experience some constipation. Increasing fluid intake and taking a fiber supplement (such as Metamucil, Citrucel, FiberCon, MiraLax, etc) 1-2 times a day regularly will usually help prevent this problem from occurring. A mild laxative (prune juice, Milk of Magnesia, MiraLax, etc) should be taken according to package directions if there are no bowel movements after 48 hours.  5. Watch out for diarrhea. If you have many loose bowel movements, simplify your diet to bland foods & liquids for a few days. Stop any stool softeners and decrease your fiber supplement. Switching to mild anti-diarrheal medications (Kayopectate, Pepto Bismol) can help. If this worsens or does not improve, please call us. 6. Wash / shower every day. You may shower over the dressings as they are waterproof. Continue to shower over incision(s) after the dressing is off. 7. Remove your waterproof bandages 5 days after surgery. You may leave the incision open to air. You may replace a dressing/Band-Aid to cover the incision for comfort if you wish.  8. ACTIVITIES as tolerated:  1. You may resume regular (light) daily activities beginning the next day--such as daily self-care,  walking, climbing stairs--gradually increasing activities as tolerated. If you can walk 30 minutes without difficulty, it is safe to try more intense activity such as jogging, treadmill, bicycling, low-impact aerobics, swimming, etc. 2. Save the most intensive and strenuous activity for last such as sit-ups, heavy lifting,  contact sports, etc Refrain from any heavy lifting or straining until you are off narcotics for pain control.  3. DO NOT PUSH THROUGH PAIN. Let pain be your guide: If it hurts to do something, don't do it. Pain is your body warning you to avoid that activity for another week until the pain goes down. 4. You may drive when you are no longer taking prescription pain medication, you can comfortably wear a seatbelt, and you can safely maneuver your car and apply brakes. 5. You may have sexual intercourse when it is comfortable.  9. FOLLOW UP in our office  1. Please call [facility] at [phone] to set up an appointment to see your surgeon in the office for a follow-up appointment approximately 2-3 weeks after your surgery. 2. Make sure that you call for this appointment the day you arrive home to insure a convenient appointment time.      10. IF YOU HAVE DISABILITY OR FAMILY LEAVE FORMS, BRING THEM TO THE               OFFICE FOR PROCESSING.   WHEN TO CALL us [phone]:  1. Poor pain control 2. Reactions / problems with new medications (rash/itching, nausea, etc)  3. Fever over 101.5 F (38.5 C) 4. Inability to urinate 5. Nausea and/or vomiting 6. Worsening swelling or bruising 7. Continued bleeding from incision. 8. Increased pain, redness, or drainage from the incision  The clinic staff is available to answer your questions during regular business hours (8:30am-5pm). Please dont hesitate to call and ask to speak to one of our nurses for clinical concerns.  If you have a medical emergency, go to the nearest emergency room or call 911.  A surgeon from [facility] is always on call at the [facility], [place], [place], [place], [place] [address] ?  MAIN: [phone] ? TOLL FREE: [phone] ?  FAX [phone]  [email or web address]

## 2015-08-11 ENCOUNTER — Inpatient Hospital Stay: Admission: RE | Admit: 2015-08-11 | Payer: Commercial Managed Care - HMO | Source: Ambulatory Visit

## 2015-08-20 DIAGNOSIS — J309 Allergic rhinitis, unspecified: Secondary | ICD-10-CM | POA: Diagnosis not present

## 2015-08-20 DIAGNOSIS — R739 Hyperglycemia, unspecified: Secondary | ICD-10-CM | POA: Diagnosis not present

## 2015-08-20 DIAGNOSIS — N183 Chronic kidney disease, stage 3 (moderate): Secondary | ICD-10-CM | POA: Diagnosis not present

## 2015-08-20 DIAGNOSIS — R000 Tachycardia, unspecified: Secondary | ICD-10-CM | POA: Diagnosis not present

## 2015-08-20 DIAGNOSIS — J628 Pneumoconiosis due to other dust containing silica: Secondary | ICD-10-CM | POA: Diagnosis not present

## 2015-08-20 DIAGNOSIS — K639 Disease of intestine, unspecified: Secondary | ICD-10-CM | POA: Diagnosis not present

## 2015-08-20 DIAGNOSIS — N400 Enlarged prostate without lower urinary tract symptoms: Secondary | ICD-10-CM | POA: Diagnosis not present

## 2015-08-20 DIAGNOSIS — Z0000 Encounter for general adult medical examination without abnormal findings: Secondary | ICD-10-CM | POA: Diagnosis not present

## 2015-08-20 DIAGNOSIS — E782 Mixed hyperlipidemia: Secondary | ICD-10-CM | POA: Diagnosis not present

## 2015-10-05 DIAGNOSIS — R972 Elevated prostate specific antigen [PSA]: Secondary | ICD-10-CM | POA: Diagnosis not present

## 2015-10-05 DIAGNOSIS — Z0000 Encounter for general adult medical examination without abnormal findings: Secondary | ICD-10-CM | POA: Diagnosis not present

## 2015-10-05 DIAGNOSIS — Z87442 Personal history of urinary calculi: Secondary | ICD-10-CM | POA: Diagnosis not present

## 2015-10-05 DIAGNOSIS — N400 Enlarged prostate without lower urinary tract symptoms: Secondary | ICD-10-CM | POA: Diagnosis not present

## 2016-02-22 IMAGING — CT CT ABD-PELV W/ CM
2 of 5 series · 15 of 46 positions shown, 17 images · IV contrast (Omni 300)
Comparison: None.

CLINICAL DATA: Diffuse abdominal pain, vomiting, possible food
poisoning after eating salmon last night at restaurant.

EXAM:
CT ABDOMEN AND PELVIS WITH CONTRAST
TECHNIQUE: Multidetector CT imaging of the abdomen and pelvis was performed
using the standard protocol following bolus administration of
intravenous contrast.
CONTRAST:  100mL OMNIPAQUE IOHEXOL 300 MG/ML  SOLN

[Series 2: a/p w/ 5mm · axial · 0.82mm/px · z∈[+759,+1219]mm · 12 of 104 slices shown, 14 images]
[im 6/104  soft-tissue]
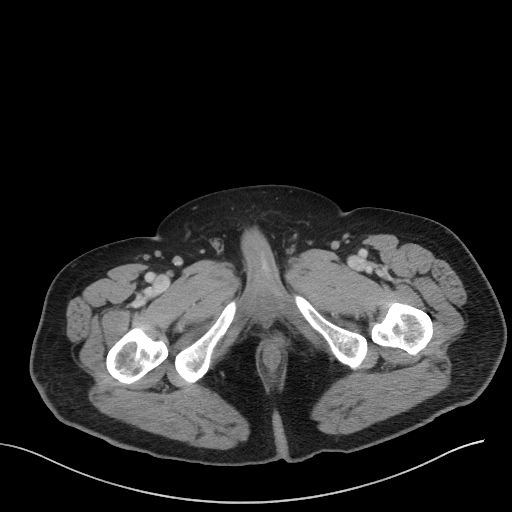
[im 6/104  bone]
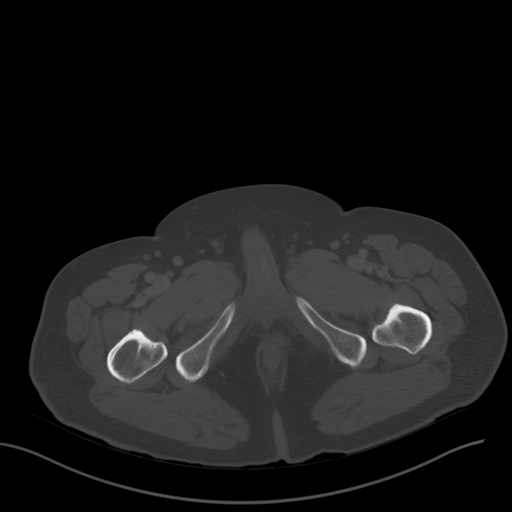
[im 18/104  soft-tissue]
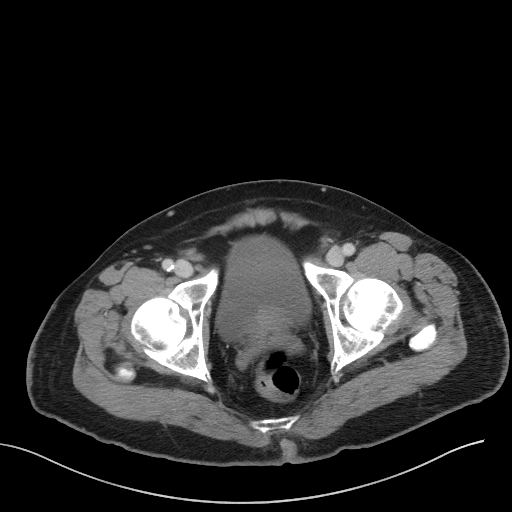
[im 23/104  soft-tissue]
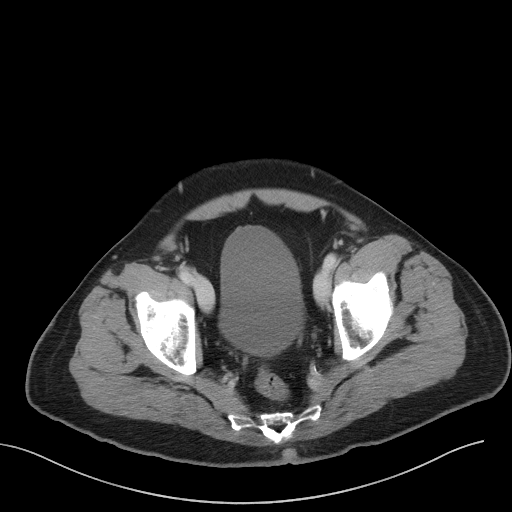
[im 29/104  soft-tissue]
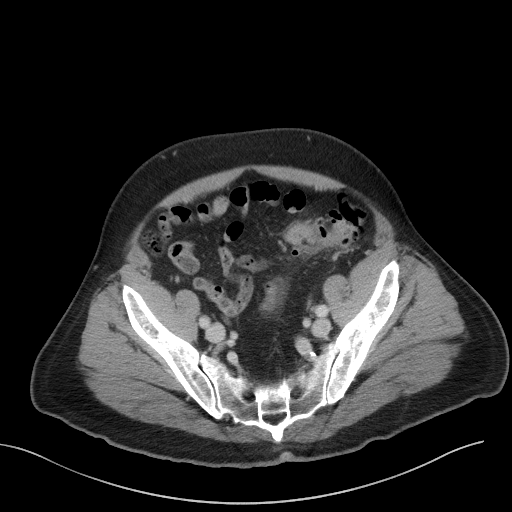
[im 41/104  soft-tissue]
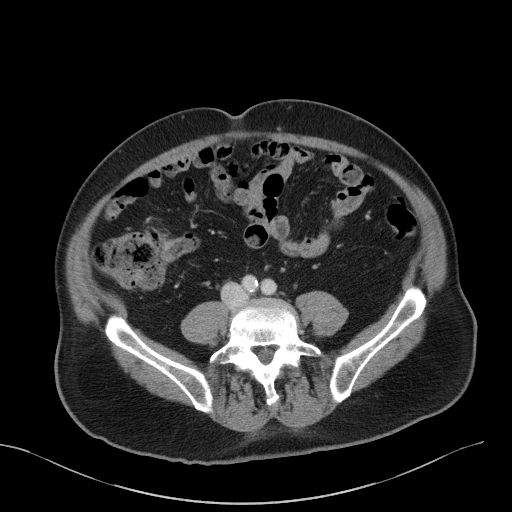
[im 46/104  soft-tissue]
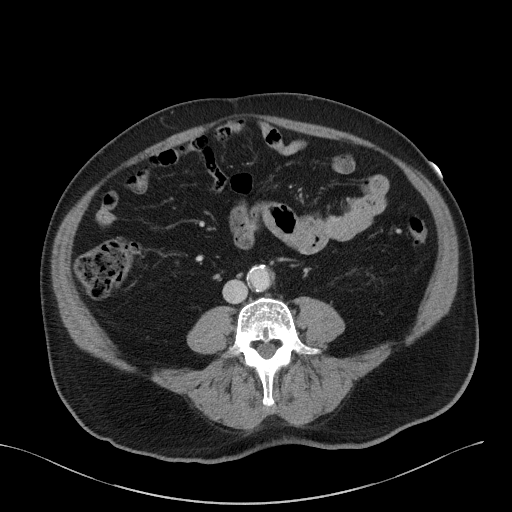
[im 58/104  soft-tissue]
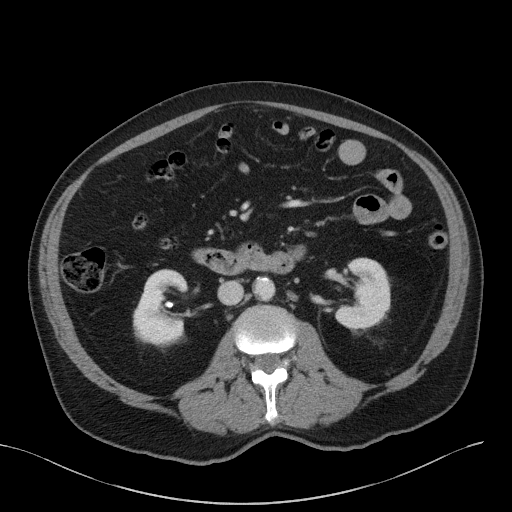
[im 63/104  soft-tissue]
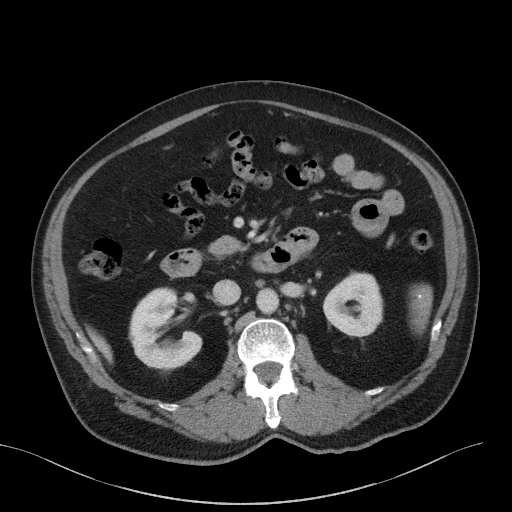
[im 75/104  soft-tissue]
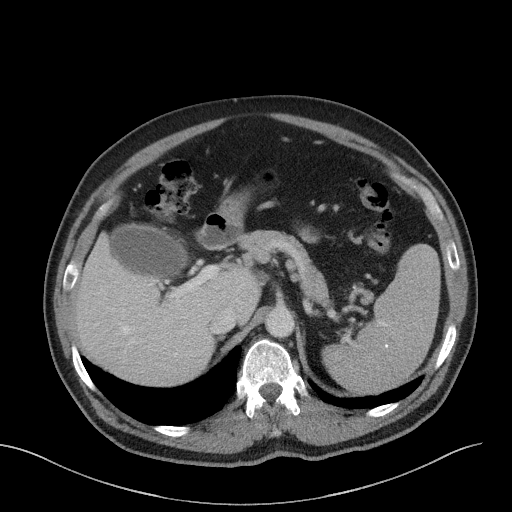
[im 75/104  bone]
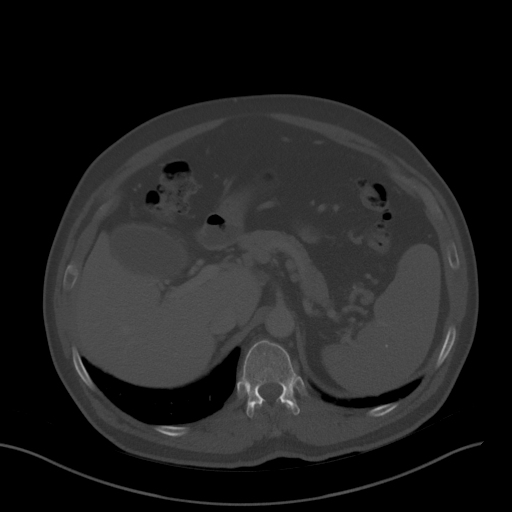
[im 81/104  soft-tissue]
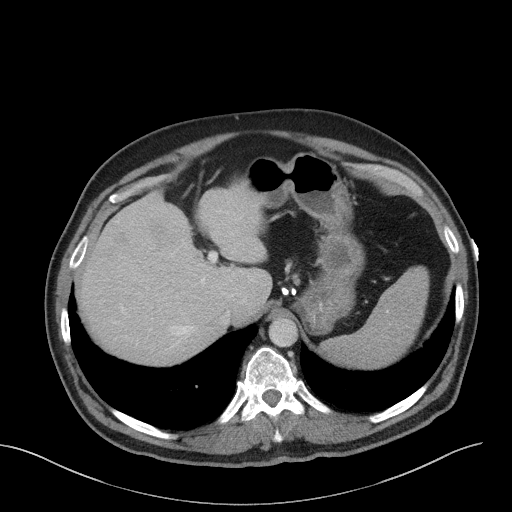
[im 86/104  soft-tissue]
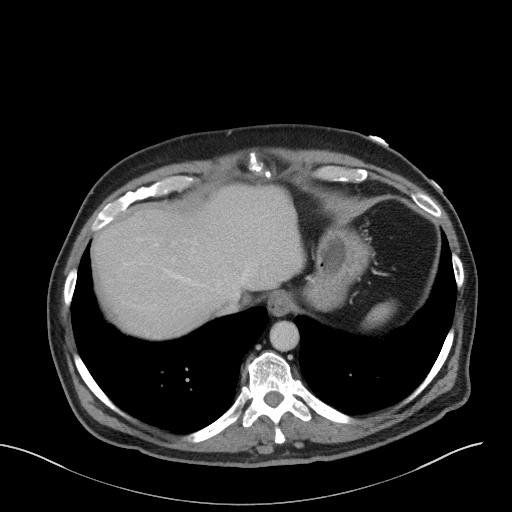
[im 98/104  soft-tissue]
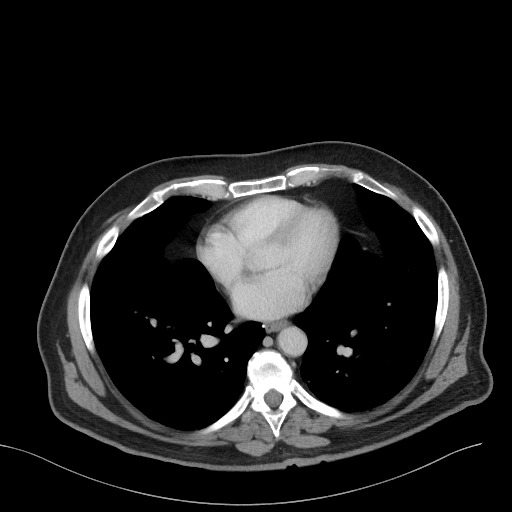

[Series 5: a/p w/ cor · coronal · 0.83mm/px · 3 of 157 slices shown]
[im 53/157  soft-tissue]
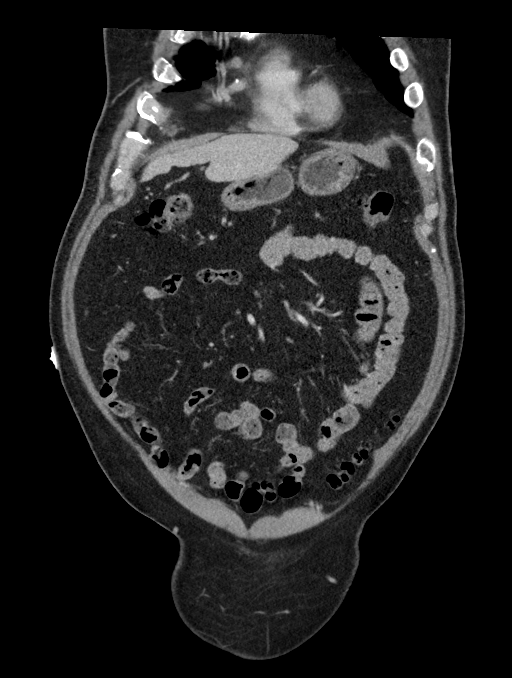
[im 70/157  soft-tissue]
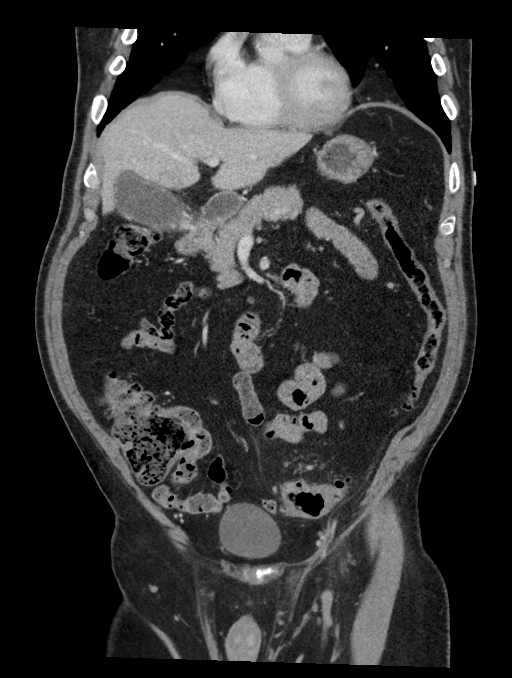
[im 87/157  soft-tissue]
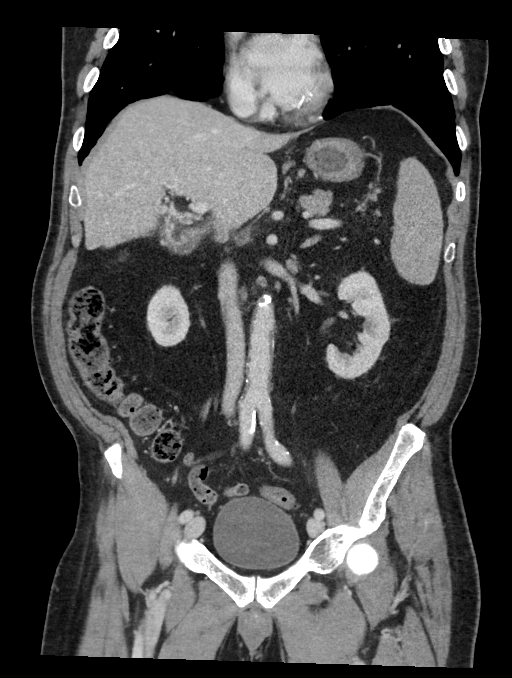

[15 of 46 positions shown; findings below may reference images not displayed]

FINDINGS: LUNG BASES: Fibrotic changes lung bases. Calcified granulomas and
calcified may be subtle lymph nodes. Heart size is normal. Mild
coronary artery calcifications. No pericardial effusion.

SOLID ORGANS: Gallbladder wall thickening, pericholecystic fluid
with multiple small gallstones. Focal fatty infiltration about the
gallbladder fossa. Calcified splenic granulomas. Thickened adrenal
glands can be seen with hyperplasia. The pancreas is unremarkable.

GASTROINTESTINAL TRACT: Moderate to severe sigmoid diverticulosis or
short segment of proximal sigmoid bowel eccentric wall thickening
and mild inflammation. The stomach, small bowel are normal in course
and caliber without inflammatory changes. Normal appendix.

KIDNEYS/ URINARY TRACT: Kidneys are orthotopic, demonstrating
symmetric enhancement. 8 mm RIGHT lower pole, 4 mm RIGHT interpolar
nephrolithiasis. No hydronephrosis or solid renal masses. The
unopacified ureters are normal in course and caliber. Delayed
imaging through the kidneys demonstrates symmetric prompt contrast
excretion within the proximal urinary collecting system. Urinary
bladder is partially distended and unremarkable.

PERITONEUM/RETROPERITONEUM: Aortoiliac vessels are normal in course
and caliber moderate to severe intimal thickening calcific
atherosclerosis. No lymphadenopathy by CT size criteria. Small
scattered calcified lymph nodes. Prostate is mildly enlarged. No
intraperitoneal free fluid nor free air.

SOFT TISSUE/OSSEOUS STRUCTURES: Non-suspicious. Small bilateral fat
containing inguinal hernias. Severe L5-S1 disc height loss, vacuum
disc and endplate spurring consistent with degenerative disc
resulting in moderate to severe LEFT neural foraminal narrowing.
IMPRESSION: Acute cholecystitis.

Diverticulosis and short segment of mild diverticulitis which may be
resolving. Given eccentric focal wall thickening, underlying mass
not excluded and follow-up colonoscopy is recommended.

## 2016-02-29 ENCOUNTER — Other Ambulatory Visit: Payer: Self-pay | Admitting: Gastroenterology

## 2016-04-10 DIAGNOSIS — R972 Elevated prostate specific antigen [PSA]: Secondary | ICD-10-CM | POA: Diagnosis not present

## 2016-04-12 ENCOUNTER — Encounter (HOSPITAL_COMMUNITY): Payer: Self-pay | Admitting: *Deleted

## 2016-04-14 DIAGNOSIS — Z125 Encounter for screening for malignant neoplasm of prostate: Secondary | ICD-10-CM | POA: Diagnosis not present

## 2016-04-14 DIAGNOSIS — N200 Calculus of kidney: Secondary | ICD-10-CM | POA: Diagnosis not present

## 2016-04-14 DIAGNOSIS — N400 Enlarged prostate without lower urinary tract symptoms: Secondary | ICD-10-CM | POA: Diagnosis not present

## 2016-04-18 ENCOUNTER — Ambulatory Visit (HOSPITAL_COMMUNITY): Payer: Commercial Managed Care - HMO | Admitting: Anesthesiology

## 2016-04-18 ENCOUNTER — Ambulatory Visit (HOSPITAL_COMMUNITY)
Admission: RE | Admit: 2016-04-18 | Discharge: 2016-04-18 | Disposition: A | Discharge status: Home / Self Care | Payer: Commercial Managed Care - HMO | Source: Ambulatory Visit | Attending: Gastroenterology | Admitting: Gastroenterology

## 2016-04-18 ENCOUNTER — Encounter (HOSPITAL_COMMUNITY): Payer: Self-pay

## 2016-04-18 ENCOUNTER — Encounter (HOSPITAL_COMMUNITY)
Admission: RE | Disposition: A | Discharge status: Home / Self Care | Payer: Self-pay | Source: Ambulatory Visit | Attending: Gastroenterology

## 2016-04-18 DIAGNOSIS — K633 Ulcer of intestine: Secondary | ICD-10-CM | POA: Diagnosis not present

## 2016-04-18 DIAGNOSIS — K5730 Diverticulosis of large intestine without perforation or abscess without bleeding: Secondary | ICD-10-CM | POA: Insufficient documentation

## 2016-04-18 DIAGNOSIS — N400 Enlarged prostate without lower urinary tract symptoms: Secondary | ICD-10-CM | POA: Diagnosis not present

## 2016-04-18 DIAGNOSIS — Z1211 Encounter for screening for malignant neoplasm of colon: Secondary | ICD-10-CM | POA: Diagnosis not present

## 2016-04-18 DIAGNOSIS — D124 Benign neoplasm of descending colon: Secondary | ICD-10-CM | POA: Diagnosis not present

## 2016-04-18 HISTORY — PX: COLONOSCOPY WITH PROPOFOL: SHX5780

## 2016-04-18 SURGERY — COLONOSCOPY WITH PROPOFOL
Anesthesia: Monitor Anesthesia Care

## 2016-04-18 MED ORDER — PROPOFOL 10 MG/ML IV BOLUS
INTRAVENOUS | Status: AC
  Filled 2016-04-18: qty 20

## 2016-04-18 MED ORDER — LACTATED RINGERS IV SOLN
INTRAVENOUS | Status: DC
Start: 2016-04-18 — End: 2016-04-18
  Administered 2016-04-18: 1000 mL via INTRAVENOUS

## 2016-04-18 MED ORDER — SODIUM CHLORIDE 0.9 % IV SOLN: INTRAVENOUS | Status: DC

## 2016-04-18 MED ORDER — PROPOFOL 10 MG/ML IV BOLUS
INTRAVENOUS | Status: DC | PRN
  Administered 2016-04-18: 100 mg via INTRAVENOUS
  Administered 2016-04-18 (×2): 50 mg via INTRAVENOUS

## 2016-04-18 SURGICAL SUPPLY — 21 items

## 2016-04-18 NOTE — Anesthesia Preprocedure Evaluation (Signed)
Anesthesia Evaluation  Patient identified by MRN, date of birth, ID band Patient awake    Reviewed: Allergy & Precautions, NPO status , Patient's Chart, lab work & pertinent test results  Airway Mallampati: II  TM Distance: >3 FB Neck ROM: Full    Dental no notable dental hx.    Pulmonary neg pulmonary ROS,    Pulmonary exam normal breath sounds clear to auscultation       Cardiovascular negative cardio ROS Normal cardiovascular exam Rhythm:Regular Rate:Normal     Neuro/Psych negative neurological ROS  negative psych ROS   GI/Hepatic negative GI ROS, Neg liver ROS,   Endo/Other  negative endocrine ROS  Renal/GU negative Renal ROS  negative genitourinary   Musculoskeletal negative musculoskeletal ROS (+)   Abdominal   Peds negative pediatric ROS (+)  Hematology negative hematology ROS (+)   Anesthesia Other Findings   Reproductive/Obstetrics negative OB ROS                             Anesthesia Physical Anesthesia Plan  ASA: I  Anesthesia Plan: MAC   Post-op Pain Management:    Induction: Intravenous  Airway Management Planned: Simple Face Mask  Additional Equipment:   Intra-op Plan:   Post-operative Plan:   Informed Consent: I have reviewed the patients History and Physical, chart, labs and discussed the procedure including the risks, benefits and alternatives for the proposed anesthesia with the patient or authorized representative who has indicated his/her understanding and acceptance.   Dental advisory given  Plan Discussed with: CRNA and Surgeon  Anesthesia Plan Comments:         Anesthesia Quick Evaluation

## 2016-04-18 NOTE — H&P (Signed)
Procedure: Screening colonoscopy. 09/07/2005 normal screening colonoscopy was performed. 08/07/2015 CT scan abdomen-pelvis showed gallstones, fatty appearing liver, sigmoid colon diverticulosis with a short segment of proximal sigmoid colon wall thickening with mild inflammation. Laparoscopic cholecystectomy was performed in February 2017.  History: The patient is a 75 year old male born 03-Jun-1941. He is scheduled to undergo a repeat screening colonoscopy today.  Past medical history: Allergic rhinitis. Silicosis. Kidney stones. Benign prostatic hypertrophy. Bilateral inguinal hernias. Tonsillectomy. Laparoscopic cholecystectomy.  Medication allergies: None  Exam: The patient is alert and lying comfortably on the endoscopy stretcher. Abdomen is soft and nontender to palpation. Lungs are clear to auscultation. Cardiac exam reveals a regular rhythm.  Plan: Proceed with screening colonoscopy

## 2016-04-18 NOTE — Discharge Instructions (Signed)

## 2016-04-18 NOTE — Transfer of Care (Signed)
Immediate Anesthesia Transfer of Care Note  Patient: Jason Hill  Procedure(s) Performed: Procedure(s): COLONOSCOPY WITH PROPOFOL (N/A)  Patient Location: PACU  Anesthesia Type:MAC  Level of Consciousness:  sedated, patient cooperative and responds to stimulation  Airway & Oxygen Therapy:Patient Spontanous Breathing   Post-op Assessment:  Report given to PACU RN and Post -op Vital signs reviewed and stable  Post vital signs:  Reviewed and stable  Last Vitals:  Vitals:   04/18/16 0952  BP: (!) 146/79  Pulse: 89  Resp: 17  Temp: [id number] C    Complications: No apparent anesthesia complications

## 2016-04-18 NOTE — Anesthesia Postprocedure Evaluation (Signed)
Anesthesia Post Note  Patient: Jason Hill  Procedure(s) Performed: Procedure(s) (LRB): COLONOSCOPY WITH PROPOFOL (N/A)  Patient location during evaluation: PACU Anesthesia Type: MAC Level of consciousness: awake and alert Pain management: pain level controlled Vital Signs Assessment: post-procedure vital signs reviewed and stable Respiratory status: spontaneous breathing, nonlabored ventilation, respiratory function stable and patient connected to nasal cannula oxygen Cardiovascular status: stable and blood pressure returned to baseline Anesthetic complications: no    Last Vitals:  Vitals:   04/18/16 1050 04/18/16 1115  BP:  (!) 133/37  Pulse:    Resp:    Temp: 36.9 C     Last Pain:  Vitals:   04/18/16 1050  TempSrc: Oral                 Habeeb Puertas S

## 2016-04-18 NOTE — Op Note (Signed)
[facility] Patient Name: Jason Hill Procedure Date: 04/18/2016 MRN: [id number] Attending MD: Garlan Fair , MD Date of Birth: [birth year] CSN: [id number] Age: 75 Admit Type: Outpatient Procedure:                Colonoscopy Indications:              Screening for colorectal malignant neoplasm Providers:                Garlan Fair, MD, Dustin Flock RN, RN, Ralene Bathe, Technician Referring MD:              Medicines:                Propofol per Anesthesia Complications:            No immediate complications. Estimated Blood Loss:     Estimated blood loss: none. Procedure:                Pre-Anesthesia Assessment:                           - Prior to the procedure, a History and Physical                            was performed, and patient medications and                            allergies were reviewed. The patient's tolerance of                            previous anesthesia was also reviewed. The risks                            and benefits of the procedure and the sedation                            options and risks were discussed with the patient.                            All questions were answered, and informed consent                            was obtained. Prior Anticoagulants: The patient has                            taken no previous anticoagulant or antiplatelet                            agents. ASA Grade Assessment: III - A patient with                            severe systemic disease. After reviewing the risks  and benefits, the patient was deemed in                            satisfactory condition to undergo the procedure.                           After obtaining informed consent, the colonoscope                            was passed under direct vision. Throughout the                            procedure, the patient's blood pressure, pulse, and   oxygen saturations were monitored continuously. The                            [id number]) scope was introduced through                            the anus and advanced to the the cecum, identified                            by appendiceal orifice and ileocecal valve. The                            colonoscopy was performed without difficulty. The                            patient tolerated the procedure well. The quality                            of the bowel preparation was good. The appendiceal                            orifice and the rectum were photographed. Scope In: 10:17:52 AM Scope Out: 10:37:26 AM Scope Withdrawal Time: 0 hours 15 minutes 15 seconds  Total Procedure Duration: 0 hours 19 minutes 34 seconds  Findings:      The perianal and digital rectal examinations were normal.      A 3 mm polyp was found in the proximal descending colon. The polyp was       sessile. The polyp was removed with a cold biopsy forceps. Resection and       retrieval were complete.      Multiple small and large-mouthed diverticula were found in the sigmoid       colon. At 25 cm from the anal verge, a large diverticulum with scarred       base and irregular edges was biopsied.      The exam was otherwise without abnormality. Impression:               - One 3 mm polyp in the proximal descending colon,                            removed with a cold biopsy forceps. Resected and  retrieved.                           - Diverticulosis in the sigmoid colon.                           - The examination was otherwise normal. Moderate Sedation:      N/A- Per Anesthesia Care Recommendation:           - Patient has a contact number available for                            emergencies. The signs and symptoms of potential                            delayed complications were discussed with the                            patient. Return to normal activities tomorrow.                             Written discharge instructions were provided to the                            patient.                           - Repeat colonoscopy is not recommended for                            surveillance.                           - Resume previous diet.                           - Continue present medications. Procedure Code(s):        --- Professional ---                           [phone], Colonoscopy, flexible; with biopsy, single                            or multiple Diagnosis Code(s):        --- Professional ---                           Z12.11, Encounter for screening for malignant                            neoplasm of colon                           D12.4, Benign neoplasm of descending colon                           K57.30, Diverticulosis of large intestine without  perforation or abscess without bleeding CPT copyright 2016 American Medical Association. All rights reserved. The codes documented in this report are preliminary and upon coder review may  be revised to meet current compliance requirements. Earle Gell, MD Garlan Fair, MD 04/18/2016 10:44:47 AM This report has been signed electronically. Number of Addenda: 0

## 2016-04-19 ENCOUNTER — Encounter (HOSPITAL_COMMUNITY): Payer: Self-pay | Admitting: Gastroenterology

## 2016-06-22 DIAGNOSIS — L218 Other seborrheic dermatitis: Secondary | ICD-10-CM | POA: Diagnosis not present

## 2016-06-22 DIAGNOSIS — L570 Actinic keratosis: Secondary | ICD-10-CM | POA: Diagnosis not present

## 2016-06-22 DIAGNOSIS — L821 Other seborrheic keratosis: Secondary | ICD-10-CM | POA: Diagnosis not present

## 2016-06-22 DIAGNOSIS — L723 Sebaceous cyst: Secondary | ICD-10-CM | POA: Diagnosis not present

## 2016-08-01 DIAGNOSIS — J069 Acute upper respiratory infection, unspecified: Secondary | ICD-10-CM | POA: Diagnosis not present

## 2016-08-15 DIAGNOSIS — L570 Actinic keratosis: Secondary | ICD-10-CM | POA: Diagnosis not present

## 2016-08-15 DIAGNOSIS — C44311 Basal cell carcinoma of skin of nose: Secondary | ICD-10-CM | POA: Diagnosis not present

## 2016-08-21 DIAGNOSIS — Z1389 Encounter for screening for other disorder: Secondary | ICD-10-CM | POA: Diagnosis not present

## 2016-08-21 DIAGNOSIS — N183 Chronic kidney disease, stage 3 (moderate): Secondary | ICD-10-CM | POA: Diagnosis not present

## 2016-08-21 DIAGNOSIS — E782 Mixed hyperlipidemia: Secondary | ICD-10-CM | POA: Diagnosis not present

## 2016-08-21 DIAGNOSIS — J310 Chronic rhinitis: Secondary | ICD-10-CM | POA: Diagnosis not present

## 2016-08-21 DIAGNOSIS — Z0000 Encounter for general adult medical examination without abnormal findings: Secondary | ICD-10-CM | POA: Diagnosis not present

## 2016-08-21 DIAGNOSIS — J628 Pneumoconiosis due to other dust containing silica: Secondary | ICD-10-CM | POA: Diagnosis not present

## 2016-08-21 DIAGNOSIS — I700 Atherosclerosis of aorta: Secondary | ICD-10-CM | POA: Diagnosis not present

## 2016-08-21 DIAGNOSIS — N400 Enlarged prostate without lower urinary tract symptoms: Secondary | ICD-10-CM | POA: Diagnosis not present

## 2016-08-21 DIAGNOSIS — R7303 Prediabetes: Secondary | ICD-10-CM | POA: Diagnosis not present

## 2016-08-30 DIAGNOSIS — C44311 Basal cell carcinoma of skin of nose: Secondary | ICD-10-CM | POA: Diagnosis not present

## 2016-08-30 DIAGNOSIS — Z85828 Personal history of other malignant neoplasm of skin: Secondary | ICD-10-CM | POA: Diagnosis not present

## 2017-03-13 DIAGNOSIS — Z0100 Encounter for examination of eyes and vision without abnormal findings: Secondary | ICD-10-CM | POA: Diagnosis not present

## 2017-03-13 DIAGNOSIS — H5213 Myopia, bilateral: Secondary | ICD-10-CM | POA: Diagnosis not present

## 2017-07-24 DIAGNOSIS — L570 Actinic keratosis: Secondary | ICD-10-CM | POA: Diagnosis not present

## 2017-07-24 DIAGNOSIS — L218 Other seborrheic dermatitis: Secondary | ICD-10-CM | POA: Diagnosis not present

## 2017-07-24 DIAGNOSIS — L723 Sebaceous cyst: Secondary | ICD-10-CM | POA: Diagnosis not present

## 2017-07-24 DIAGNOSIS — L821 Other seborrheic keratosis: Secondary | ICD-10-CM | POA: Diagnosis not present

## 2017-07-24 DIAGNOSIS — D225 Melanocytic nevi of trunk: Secondary | ICD-10-CM | POA: Diagnosis not present

## 2017-07-24 DIAGNOSIS — Z85828 Personal history of other malignant neoplasm of skin: Secondary | ICD-10-CM | POA: Diagnosis not present

## 2017-08-22 DIAGNOSIS — J309 Allergic rhinitis, unspecified: Secondary | ICD-10-CM | POA: Diagnosis not present

## 2017-08-22 DIAGNOSIS — R739 Hyperglycemia, unspecified: Secondary | ICD-10-CM | POA: Diagnosis not present

## 2017-08-22 DIAGNOSIS — Z0000 Encounter for general adult medical examination without abnormal findings: Secondary | ICD-10-CM | POA: Diagnosis not present

## 2017-08-22 DIAGNOSIS — Z1389 Encounter for screening for other disorder: Secondary | ICD-10-CM | POA: Diagnosis not present

## 2017-08-22 DIAGNOSIS — N183 Chronic kidney disease, stage 3 (moderate): Secondary | ICD-10-CM | POA: Diagnosis not present

## 2017-08-22 DIAGNOSIS — E782 Mixed hyperlipidemia: Secondary | ICD-10-CM | POA: Diagnosis not present

## 2017-08-22 DIAGNOSIS — N400 Enlarged prostate without lower urinary tract symptoms: Secondary | ICD-10-CM | POA: Diagnosis not present

## 2017-08-22 DIAGNOSIS — I700 Atherosclerosis of aorta: Secondary | ICD-10-CM | POA: Diagnosis not present

## 2017-08-22 DIAGNOSIS — R972 Elevated prostate specific antigen [PSA]: Secondary | ICD-10-CM | POA: Diagnosis not present

## 2017-08-22 DIAGNOSIS — J628 Pneumoconiosis due to other dust containing silica: Secondary | ICD-10-CM | POA: Diagnosis not present

## 2018-01-07 DIAGNOSIS — R05 Cough: Secondary | ICD-10-CM | POA: Diagnosis not present

## 2018-01-07 DIAGNOSIS — J069 Acute upper respiratory infection, unspecified: Secondary | ICD-10-CM | POA: Diagnosis not present

## 2018-04-05 DIAGNOSIS — J069 Acute upper respiratory infection, unspecified: Secondary | ICD-10-CM | POA: Diagnosis not present

## 2018-07-25 DIAGNOSIS — L821 Other seborrheic keratosis: Secondary | ICD-10-CM | POA: Diagnosis not present

## 2018-07-25 DIAGNOSIS — Z85828 Personal history of other malignant neoplasm of skin: Secondary | ICD-10-CM | POA: Diagnosis not present

## 2018-07-25 DIAGNOSIS — L570 Actinic keratosis: Secondary | ICD-10-CM | POA: Diagnosis not present

## 2018-07-25 DIAGNOSIS — L720 Epidermal cyst: Secondary | ICD-10-CM | POA: Diagnosis not present

## 2018-08-27 DIAGNOSIS — N200 Calculus of kidney: Secondary | ICD-10-CM | POA: Diagnosis not present

## 2018-08-27 DIAGNOSIS — R972 Elevated prostate specific antigen [PSA]: Secondary | ICD-10-CM | POA: Diagnosis not present

## 2018-08-27 DIAGNOSIS — J309 Allergic rhinitis, unspecified: Secondary | ICD-10-CM | POA: Diagnosis not present

## 2018-08-27 DIAGNOSIS — I700 Atherosclerosis of aorta: Secondary | ICD-10-CM | POA: Diagnosis not present

## 2018-08-27 DIAGNOSIS — N183 Chronic kidney disease, stage 3 (moderate): Secondary | ICD-10-CM | POA: Diagnosis not present

## 2018-08-27 DIAGNOSIS — J628 Pneumoconiosis due to other dust containing silica: Secondary | ICD-10-CM | POA: Diagnosis not present

## 2018-08-27 DIAGNOSIS — Z1389 Encounter for screening for other disorder: Secondary | ICD-10-CM | POA: Diagnosis not present

## 2018-08-27 DIAGNOSIS — E782 Mixed hyperlipidemia: Secondary | ICD-10-CM | POA: Diagnosis not present

## 2018-08-27 DIAGNOSIS — R7303 Prediabetes: Secondary | ICD-10-CM | POA: Diagnosis not present

## 2018-08-27 DIAGNOSIS — Z0000 Encounter for general adult medical examination without abnormal findings: Secondary | ICD-10-CM | POA: Diagnosis not present

## 2019-01-02 DIAGNOSIS — L720 Epidermal cyst: Secondary | ICD-10-CM | POA: Diagnosis not present

## 2019-01-02 DIAGNOSIS — L821 Other seborrheic keratosis: Secondary | ICD-10-CM | POA: Diagnosis not present

## 2019-01-02 DIAGNOSIS — Z85828 Personal history of other malignant neoplasm of skin: Secondary | ICD-10-CM | POA: Diagnosis not present

## 2019-09-02 DIAGNOSIS — N400 Enlarged prostate without lower urinary tract symptoms: Secondary | ICD-10-CM | POA: Diagnosis not present

## 2019-09-02 DIAGNOSIS — Z1389 Encounter for screening for other disorder: Secondary | ICD-10-CM | POA: Diagnosis not present

## 2019-09-02 DIAGNOSIS — N1831 Chronic kidney disease, stage 3a: Secondary | ICD-10-CM | POA: Diagnosis not present

## 2019-09-02 DIAGNOSIS — E782 Mixed hyperlipidemia: Secondary | ICD-10-CM | POA: Diagnosis not present

## 2019-09-02 DIAGNOSIS — J628 Pneumoconiosis due to other dust containing silica: Secondary | ICD-10-CM | POA: Diagnosis not present

## 2019-09-02 DIAGNOSIS — J309 Allergic rhinitis, unspecified: Secondary | ICD-10-CM | POA: Diagnosis not present

## 2019-09-02 DIAGNOSIS — I700 Atherosclerosis of aorta: Secondary | ICD-10-CM | POA: Diagnosis not present

## 2019-09-02 DIAGNOSIS — R7309 Other abnormal glucose: Secondary | ICD-10-CM | POA: Diagnosis not present

## 2019-09-02 DIAGNOSIS — Z0000 Encounter for general adult medical examination without abnormal findings: Secondary | ICD-10-CM | POA: Diagnosis not present

## 2019-10-14 DIAGNOSIS — L720 Epidermal cyst: Secondary | ICD-10-CM | POA: Diagnosis not present

## 2019-10-14 DIAGNOSIS — Z85828 Personal history of other malignant neoplasm of skin: Secondary | ICD-10-CM | POA: Diagnosis not present

## 2019-10-14 DIAGNOSIS — B351 Tinea unguium: Secondary | ICD-10-CM | POA: Diagnosis not present

## 2019-10-14 DIAGNOSIS — L821 Other seborrheic keratosis: Secondary | ICD-10-CM | POA: Diagnosis not present

## 2019-10-14 DIAGNOSIS — L570 Actinic keratosis: Secondary | ICD-10-CM | POA: Diagnosis not present

## 2019-11-19 DIAGNOSIS — M779 Enthesopathy, unspecified: Secondary | ICD-10-CM | POA: Diagnosis not present

## 2020-02-18 DIAGNOSIS — M67911 Unspecified disorder of synovium and tendon, right shoulder: Secondary | ICD-10-CM | POA: Diagnosis not present

## 2020-02-23 ENCOUNTER — Ambulatory Visit: Payer: Medicare HMO | Attending: Surgical | Admitting: Physical Therapy

## 2020-02-23 ENCOUNTER — Other Ambulatory Visit: Payer: Self-pay

## 2020-02-23 ENCOUNTER — Encounter: Payer: Self-pay | Admitting: Physical Therapy

## 2020-02-23 DIAGNOSIS — M25511 Pain in right shoulder: Secondary | ICD-10-CM | POA: Diagnosis not present

## 2020-02-23 NOTE — Therapy (Signed)
[facility] [place], [place], [address] Phone: [phone]   Fax:  [phone]  Physical Therapy Evaluation  Patient Details  Name: Jason Hill MRN: [id number] Date of Birth: 1941-02-08 Referring Provider (PT): Grier Mitts PA-C.   Encounter Date: 02/23/2020   PT End of Session - 02/23/20 8413    Visit Number 1    Number of Visits 12    Date for PT Re-Evaluation 04/05/20    PT Start Time 0900    PT Stop Time 0941    PT Time Calculation (min) 41 min    Activity Tolerance Patient tolerated treatment well    Behavior During Therapy [facility] for tasks assessed/performed           Past Medical History:  Diagnosis Date  . History of kidney stones   . Pneumonia   . Silicosis [facility])     Past Surgical History:  Procedure Laterality Date  . CHOLECYSTECTOMY  08/09/2015  . CHOLECYSTECTOMY N/A 08/07/2015   Procedure: LAPAROSCOPIC CHOLECYSTECTOMY WITH POSSIBLE INTRAOPERATIVE CHOLANGIOGRAM;  Surgeon: Coralie Keens, MD;  Location: [facility];  Service: General;  Laterality: N/A;  . COLONOSCOPY WITH PROPOFOL N/A 04/18/2016   Procedure: COLONOSCOPY WITH PROPOFOL;  Surgeon: Garlan Fair, MD;  Location: [facility];  Service: Endoscopy;  Laterality: N/A;    There were no vitals filed for this visit.    Subjective Assessment - 02/23/20 0920    Subjective COVID-19 screen performed prior to patient entering clinic.  The patient presents to the clinic today with c/o right shoulder pain after doing some yardwork.  He reports his pain is a 3/10 today and can increase with increased activity.  Rest decreases pain.    Pertinent History Silicosis.    Patient Stated Goals Use right arm without pain.    Currently in Pain? Yes    Pain Score 3     Pain Location Shoulder    Pain Orientation Right    Pain Descriptors / Indicators Aching;Sore    Pain Type Acute pain    Pain Onset More than a month ago    Pain Frequency Constant     Aggravating Factors  See above.    Pain Relieving Factors See above.              [facility] PT Assessment - 02/23/20 0001      Assessment   Medical Diagnosis Right shoulder tendinosis.    Referring Provider (PT) Grier Mitts PA-C.    Onset Date/Surgical Date --   April 2021.     Precautions   Precautions None      Restrictions   Weight Bearing Restrictions No      Balance Screen   Has the patient fallen in the past 6 months No    Has the patient had a decrease in activity level because of a fear of falling?  No    Is the patient reluctant to leave their home because of a fear of falling?  No      Home Environment   Living Environment Private residence      Prior Function   Level of Independence Independent      Posture/Postural Control   Posture/Postural Control Postural limitations    Postural Limitations Rounded Shoulders;Forward head      ROM / Strength   AROM / PROM / Strength AROM;Strength      AROM   Overall AROM Comments Full active right shoulder range of motion.  Strength   Overall Strength Comments Right shoulder strength essentially normal though ER graded at 4+/5.      Palpation   Palpation comment Tender to palpation over patient's right posterior cuff musculature with some referral of pain into his upper arm.                      Objective measurements completed on examination: See above findings.       [facility] Adult PT Treatment/Exercise - 02/23/20 0001      Modalities   Modalities Electrical Stimulation      Electrical Stimulation   Electrical Stimulation Location Right posterior shoulder.    Electrical Stimulation Action Pre-mod.    Electrical Stimulation Parameters 80-150 Hz x 15 minutes.    Electrical Stimulation Goals Tone;Pain                       PT Long Term Goals - 02/23/20 0941      PT LONG TERM GOAL #1   Title Independent with a HEP.    Time 6    Period Weeks    Status New      PT LONG  TERM GOAL #2   Title Perform ADL's with pain not > 2/10.    Time 6    Period Weeks    Status New                  Plan - 02/23/20 0933    Clinical Impression Statement The patient presenst to OPPT with c/o right shoulder pain that has been ongoing since April of this year after doing yardwork.  His right shoulder active range of motion is normal and he lacks just minimal external rotation strength.  He is tender to palpation in the region of his right posterior cuff with some referred pain into his upper arm.  Patient will benefit from skilled physical therapy intervention to address deficits and pain.    Personal Factors and Comorbidities Comorbidity 1    Comorbidities Silicosis.    Examination-Activity Limitations Other    Examination-Participation Restrictions Other    Stability/Clinical Decision Making Stable/Uncomplicated    Clinical Decision Making Low    Rehab Potential Excellent    PT Frequency 2x / week    PT Duration 6 weeks    PT Treatment/Interventions ADLs/Self Care Home Management;Cryotherapy;Electrical Stimulation;Ultrasound;Moist Heat;Iontophoresis 4mg /ml Dexamethasone;Therapeutic exercise;Therapeutic activities;Manual techniques;Patient/family education;Dry needling;Vasopneumatic Device    PT Next Visit Plan Combo e'stim/US to patient's right posterior shoulder; STW/M, RW4, [facility].    Consulted and Agree with Plan of Care Patient           Patient will benefit from skilled therapeutic intervention in order to improve the following deficits and impairments:  Pain, Decreased activity tolerance, Decreased strength  Visit Diagnosis: Acute pain of right shoulder - Plan: PT plan of care cert/re-cert     Problem List Patient Active Problem List   Diagnosis Date Noted  . Acute cholecystitis 08/07/2015  . DEVIATED NASAL SEPTUM 08/26/2008  . ALLERGIC RHINITIS 08/26/2008  . SILICOSIS 59/93/5701  . COUGH 08/26/2008    Persephone Schriever, [place] mpt 02/23/2020, 9:46  AM  [facility] [address] [place], [place], [address] Phone: [phone]   Fax:  [phone]  Name: Jason Hill MRN: [id number] Date of Birth: May 31, 1941

## 2020-02-27 ENCOUNTER — Other Ambulatory Visit: Payer: Self-pay

## 2020-02-27 ENCOUNTER — Encounter: Payer: Self-pay | Admitting: *Deleted

## 2020-02-27 ENCOUNTER — Ambulatory Visit: Payer: Medicare HMO | Admitting: *Deleted

## 2020-02-27 DIAGNOSIS — M25511 Pain in right shoulder: Secondary | ICD-10-CM

## 2020-02-27 NOTE — Therapy (Signed)
[facility] [place], [place], [address] Phone: [phone]   Fax:  [phone]  Physical Therapy Treatment  Patient Details  Name: TAHER VANNOTE MRN: [id number] Date of Birth: [birth year] Referring Provider (PT): Grier Mitts PA-C.   Encounter Date: 02/27/2020   PT End of Session - 02/27/20 0737    Visit Number 2    Number of Visits 12    Date for PT Re-Evaluation 04/05/20    PT Start Time 0900    PT Stop Time 0950    PT Time Calculation (min) 50 min           Past Medical History:  Diagnosis Date  . History of kidney stones   . Pneumonia   . Silicosis [facility])     Past Surgical History:  Procedure Laterality Date  . CHOLECYSTECTOMY  08/09/2015  . CHOLECYSTECTOMY N/A 08/07/2015   Procedure: LAPAROSCOPIC CHOLECYSTECTOMY WITH POSSIBLE INTRAOPERATIVE CHOLANGIOGRAM;  Surgeon: Coralie Keens, MD;  Location: [place];  Service: General;  Laterality: N/A;  . COLONOSCOPY WITH PROPOFOL N/A 04/18/2016   Procedure: COLONOSCOPY WITH PROPOFOL;  Surgeon: Garlan Fair, MD;  [facility];  Service: Endoscopy;  Laterality: N/A;    There were no vitals filed for this visit.   Subjective Assessment - 02/27/20 0902    Subjective COVID-19 screen performed prior to patient entering clinic.    Pertinent History Silicosis.    Patient Stated Goals Use right arm without pain.    Currently in Pain? Yes    Pain Score 3     Pain Location Shoulder    Pain Orientation Right    Pain Descriptors / Indicators Aching;Sore    Pain Type Acute pain    Pain Onset More than a month ago                             [facility] Adult PT Treatment/Exercise - 02/27/20 0001      Modalities   Modalities Electrical Stimulation;Ultrasound      Electrical Stimulation   Electrical Stimulation Location Right posterior shoulder.    Research officer, political party Parameters 80-150hz  x15  mins    Electrical Stimulation Goals Tone;Pain      Ultrasound   Ultrasound Location RT shldr post cuff    Ultrasound Parameters combo 1.5 w/cm2 x 10 mins    Ultrasound Goals Pain      Manual Therapy   Manual Therapy Soft tissue mobilization;Myofascial release    Soft tissue mobilization STW to RT posterior cuff as well as UT and supraspinatus                       PT Long Term Goals - 02/23/20 0941      PT LONG TERM GOAL #1   Title Independent with a HEP.    Time 6    Period Weeks    Status New      PT LONG TERM GOAL #2   Title Perform ADL's with pain not > 2/10.    Time 6    Period Weeks    Status New                 Plan - 02/27/20 1149    Clinical Impression Statement Pt arrived today in good spirits and reports doing ok after eval. He still has notable tenderness along posterior cuff and into distal acromialspace. [place] combo  and STW was performed to this area and tolerated very well. Normal modality response with estim.    Personal Factors and Comorbidities Comorbidity 1    Comorbidities Silicosis.    Examination-Activity Limitations Other    Examination-Participation Restrictions Other    Stability/Clinical Decision Making Stable/Uncomplicated    Rehab Potential Excellent    PT Frequency 2x / week    PT Duration 6 weeks    PT Treatment/Interventions ADLs/Self Care Home Management;Cryotherapy;Electrical Stimulation;Ultrasound;Moist Heat;Iontophoresis 4mg /ml Dexamethasone;Therapeutic exercise;Therapeutic activities;Manual techniques;Patient/family education;Dry needling;Vasopneumatic Device    PT Next Visit Plan Combo e'stim/US to patient's right posterior shoulder; STW/M, RW4, [facility] ER.    Consulted and Agree with Plan of Care Patient           Patient will benefit from skilled therapeutic intervention in order to improve the following deficits and impairments:  Pain, Decreased activity tolerance, Decreased strength  Visit Diagnosis: Acute pain  of right shoulder     Problem List Patient Active Problem List   Diagnosis Date Noted  . Acute cholecystitis 08/07/2015  . DEVIATED NASAL SEPTUM 08/26/2008  . ALLERGIC RHINITIS 08/26/2008  . SILICOSIS 62/56/3893  . COUGH 08/26/2008    Juanmiguel Defelice,CHRIS, PTA 02/27/2020, 11:57 AM  [facility] [address] [place], [place], [address] Phone: [phone]   Fax:  [phone]  Name: YUSIF GNAU MRN: [id number] Date of Birth: Aug 02, 1940

## 2020-03-02 ENCOUNTER — Ambulatory Visit: Payer: Medicare HMO | Admitting: *Deleted

## 2020-03-02 ENCOUNTER — Other Ambulatory Visit: Payer: Self-pay

## 2020-03-02 ENCOUNTER — Encounter: Payer: Self-pay | Admitting: *Deleted

## 2020-03-02 DIAGNOSIS — M25511 Pain in right shoulder: Secondary | ICD-10-CM

## 2020-03-02 NOTE — Therapy (Signed)
[facility] [place], [place], [address] Phone: [phone]   Fax:  [phone]  Physical Therapy Treatment  Patient Details  Name: Jason Hill MRN: [id number] Date of Birth: 1940-07-12 Referring Provider (PT): Grier Mitts PA-C.   Encounter Date: 03/02/2020   PT End of Session - 03/02/20 1257    Visit Number 3    Number of Visits 12    Date for PT Re-Evaluation 04/05/20    PT Start Time 1300    PT Stop Time 1350    PT Time Calculation (min) 50 min           Past Medical History:  Diagnosis Date  . History of kidney stones   . Pneumonia   . Silicosis [facility])     Past Surgical History:  Procedure Laterality Date  . CHOLECYSTECTOMY  08/09/2015  . CHOLECYSTECTOMY N/A 08/07/2015   Procedure: LAPAROSCOPIC CHOLECYSTECTOMY WITH POSSIBLE INTRAOPERATIVE CHOLANGIOGRAM;  Surgeon: Coralie Keens, MD;  Location: [place];  Service: General;  Laterality: N/A;  . COLONOSCOPY WITH PROPOFOL N/A 04/18/2016   Procedure: COLONOSCOPY WITH PROPOFOL;  Surgeon: Garlan Fair, MD;  [facility];  Service: Endoscopy;  Laterality: N/A;    There were no vitals filed for this visit.   Subjective Assessment - 03/02/20 1358    Subjective COVID-19 screen performed prior to patient entering clinic.  Did good after last Rx    Pertinent History Silicosis.    Patient Stated Goals Use right arm without pain.    Currently in Pain? Yes    Pain Score 2     Pain Orientation Right    Pain Descriptors / Indicators Aching;Sore    Pain Type Acute pain    Pain Onset More than a month ago                             [facility] Adult PT Treatment/Exercise - 03/02/20 0001      Exercises   Exercises Shoulder      Shoulder Exercises: Standing   Other Standing Exercises RW 4 there with yellow tubing x 20 each ex.  Sheet  and band given for HEP      Modalities   Modalities Electrical Stimulation;Ultrasound       Electrical Stimulation   Electrical Stimulation Location Right posterior shoulder.    Electrical Stimulation Action premod    Electrical Stimulation Parameters 80-150hz  x 15 mins    Electrical Stimulation Goals Tone;Pain      Ultrasound   Ultrasound Location RT shldr posterior cuff area    Ultrasound Parameters combo 1.5 w/cm2x 8 mins    Ultrasound Goals Pain      Manual Therapy   Manual Therapy Soft tissue mobilization;Myofascial release    Soft tissue mobilization STW to RT posterior cuff as well as UT and supraspinatus fossa with ischemic release                       PT Long Term Goals - 02/23/20 0941      PT LONG TERM GOAL #1   Title Independent with a HEP.    Time 6    Period Weeks    Status New      PT LONG TERM GOAL #2   Title Perform ADL's with pain not > 2/10.    Time 6    Period Weeks    Status New  Plan - 03/02/20 1258    Clinical Impression Statement Pt arrived today doing fairly well and reports of doing good after last Rx. He was instructed in RW 4 with yellow tband with no pain increase and mainly fatigue. [place] combo was performed f/b STW to posterior cuff area. No c/o pain after Rx.    Personal Factors and Comorbidities Comorbidity 1    Examination-Activity Limitations Other    Examination-Participation Restrictions Other    Stability/Clinical Decision Making Stable/Uncomplicated    Rehab Potential Excellent    PT Frequency 2x / week    PT Duration 6 weeks    PT Treatment/Interventions ADLs/Self Care Home Management;Cryotherapy;Electrical Stimulation;Ultrasound;Moist Heat;Iontophoresis 4mg /ml Dexamethasone;Therapeutic exercise;Therapeutic activities;Manual techniques;Patient/family education;Dry needling;Vasopneumatic Device    PT Next Visit Plan Combo e'stim/US to patient's right posterior shoulder; STW/M, RW4, [facility] ER.    Consulted and Agree with Plan of Care Patient           Patient will benefit from skilled  therapeutic intervention in order to improve the following deficits and impairments:  Pain, Decreased activity tolerance, Decreased strength  Visit Diagnosis: Acute pain of right shoulder     Problem List Patient Active Problem List   Diagnosis Date Noted  . Acute cholecystitis 08/07/2015  . DEVIATED NASAL SEPTUM 08/26/2008  . ALLERGIC RHINITIS 08/26/2008  . SILICOSIS 10/62/6948  . COUGH 08/26/2008    Adylynn Hertenstein,CHRIS, PTA 03/02/2020, 1:59 PM  [facility] [place], [place], [address] Phone: [phone]   Fax:  [phone]  Name: Jason Hill MRN: [id number] Date of Birth: 1940/12/14

## 2020-03-05 ENCOUNTER — Other Ambulatory Visit: Payer: Self-pay

## 2020-03-05 ENCOUNTER — Ambulatory Visit: Payer: Medicare HMO | Attending: Surgical | Admitting: *Deleted

## 2020-03-05 DIAGNOSIS — M25511 Pain in right shoulder: Secondary | ICD-10-CM | POA: Diagnosis not present

## 2020-03-05 NOTE — Therapy (Signed)
[facility] [place], [place], [address] Phone: [phone]   Fax:  [phone]  Physical Therapy Treatment  Patient Details  Name: Jason Hill MRN: [id number] Date of Birth: Apr 15, 1941 Referring Provider (PT): Grier Mitts PA-C.   Encounter Date: 03/05/2020   PT End of Session - 03/05/20 0918    Visit Number 4    Number of Visits 12    Date for PT Re-Evaluation 04/05/20    PT Start Time 0900    PT Stop Time 0950    PT Time Calculation (min) 50 min           Past Medical History:  Diagnosis Date  . History of kidney stones   . Pneumonia   . Silicosis [facility])     Past Surgical History:  Procedure Laterality Date  . CHOLECYSTECTOMY  08/09/2015  . CHOLECYSTECTOMY N/A 08/07/2015   Procedure: LAPAROSCOPIC CHOLECYSTECTOMY WITH POSSIBLE INTRAOPERATIVE CHOLANGIOGRAM;  Surgeon: Coralie Keens, MD;  Location: [place];  Service: General;  Laterality: N/A;  . COLONOSCOPY WITH PROPOFOL N/A 04/18/2016   Procedure: COLONOSCOPY WITH PROPOFOL;  Surgeon: Garlan Fair, MD;  [facility];  Service: Endoscopy;  Laterality: N/A;    There were no vitals filed for this visit.   Subjective Assessment - 03/05/20 0913    Subjective COVID-19 screen performed prior to patient entering clinic.  Did good after last Rx  2/10   50% better    Pertinent History Silicosis.    Patient Stated Goals Use right arm without pain.    Currently in Pain? Yes    Pain Score 2     Pain Location Shoulder    Pain Orientation Right    Pain Type Acute pain                             [facility] Adult PT Treatment/Exercise - 03/05/20 0001      Exercises   Exercises Shoulder      Shoulder Exercises: Standing   Other Standing Exercises RW 4 therex  with yellow tubing x 25 each ex.  Sheet  and band given for HEP      Modalities   Modalities Electrical Stimulation;Ultrasound      Electrical Stimulation   Electrical  Stimulation Location Right posterior shoulder.    Electrical Stimulation Action premod    Electrical Stimulation Parameters 80-150hz  x 15 mins    Electrical Stimulation Goals Tone;Pain      Ultrasound   Ultrasound Location RT shldr posterior cuff and supraspinatus fossa    Ultrasound Parameters combo 1.5 w/cm2 x 10 mins    Ultrasound Goals Pain      Manual Therapy   Manual Therapy Soft tissue mobilization;Myofascial release    Soft tissue mobilization STW to RT posterior cuff as well as UT and supraspinatus fossa with ischemic release                       PT Long Term Goals - 03/05/20 1036      PT LONG TERM GOAL #1   Title Independent with a HEP.    Time 6    Period Weeks    Status Partially Met      PT LONG TERM GOAL #2   Title Perform ADL's with pain not > 2/10.    Time 6    Period Weeks    Status On-going  Plan - 03/05/20 1019    Clinical Impression Statement Pt arrived today reporting doing 50% better with decreased pain and no pain meds last night. Pt performed RW4 exs again with v/c's needed for technique , but tolerated well. Normal modality response.    Personal Factors and Comorbidities Comorbidity 1    Comorbidities Silicosis.    Examination-Participation Restrictions Other    Stability/Clinical Decision Making Stable/Uncomplicated    Rehab Potential Excellent    PT Frequency 2x / week    PT Duration 6 weeks    PT Treatment/Interventions ADLs/Self Care Home Management;Cryotherapy;Electrical Stimulation;Ultrasound;Moist Heat;Iontophoresis 32m/ml Dexamethasone;Therapeutic exercise;Therapeutic activities;Manual techniques;Patient/family education;Dry needling;Vasopneumatic Device    PT Next Visit Plan Combo e'stim/US to patient's right posterior shoulder; STW[facility]    Consulted and Agree with Plan of Care Patient           Patient will benefit from skilled therapeutic intervention in order to improve the following  deficits and impairments:  Pain, Decreased activity tolerance, Decreased strength  Visit Diagnosis: Acute pain of right shoulder     Problem List Patient Active Problem List   Diagnosis Date Noted  . Acute cholecystitis 08/07/2015  . DEVIATED NASAL SEPTUM 08/26/2008  . ALLERGIC RHINITIS 08/26/2008  . SILICOSIS 041/71/2787 . COUGH 08/26/2008    Jason Hill,Jason Hill, PTA 03/05/2020, 10:36 AM  [facility] [address]Phone: [phone]  Fax:  [phone] Name: Jason POPELKAMRN: [id number] of Birth: [birth year]

## 2020-03-09 ENCOUNTER — Ambulatory Visit: Payer: Medicare HMO | Admitting: Physical Therapy

## 2020-03-09 ENCOUNTER — Encounter: Payer: Self-pay | Admitting: Physical Therapy

## 2020-03-09 DIAGNOSIS — M25511 Pain in right shoulder: Secondary | ICD-10-CM | POA: Diagnosis not present

## 2020-03-09 NOTE — Therapy (Signed)
[facility] [place], [place], [address] Phone: [phone]   Fax:  [phone]  Physical Therapy Treatment  Patient Details  Name: Jason Hill MRN: [id number] Date of Birth: [birth year] Referring Provider (PT): Grier Mitts PA-C.   Encounter Date: 03/09/2020   PT End of Session - 03/09/20 1635    Visit Number 5    Number of Visits 12    Date for PT Re-Evaluation 04/05/20    PT Start Time 1635    PT Stop Time 3810    PT Time Calculation (min) 40 min    Activity Tolerance Patient tolerated treatment well    Behavior During Therapy [facility] for tasks assessed/performed           Past Medical History:  Diagnosis Date  . History of kidney stones   . Pneumonia   . Silicosis [facility])     Past Surgical History:  Procedure Laterality Date  . CHOLECYSTECTOMY  08/09/2015  . CHOLECYSTECTOMY N/A 08/07/2015   Procedure: LAPAROSCOPIC CHOLECYSTECTOMY WITH POSSIBLE INTRAOPERATIVE CHOLANGIOGRAM;  Surgeon: Coralie Keens, MD;  Location: [place];  Service: General;  Laterality: N/A;  . COLONOSCOPY WITH PROPOFOL N/A 04/18/2016   Procedure: COLONOSCOPY WITH PROPOFOL;  Surgeon: Garlan Fair, MD;  [facility];  Service: Endoscopy;  Laterality: N/A;    There were no vitals filed for this visit.   Subjective Assessment - 03/09/20 1633    Subjective COVID-19 screen performed prior to patient entering clinic. Reports that he has been having a cramp type sensation in R deltoids over the weekend but is compliant with HEP.    Pertinent History Silicosis.    Patient Stated Goals Use right arm without pain.    Currently in Pain? No/denies              [facility] PT Assessment - 03/09/20 0001      Assessment   Medical Diagnosis Right shoulder tendinosis.    Referring Provider (PT) Grier Mitts PA-C.    Next MD Visit None      Precautions   Precautions None      Restrictions   Weight Bearing Restrictions No                          [facility] Adult PT Treatment/Exercise - 03/09/20 0001      Modalities   Modalities Electrical Stimulation;Ultrasound;Iontophoresis      Loss adjuster, chartered Action Pre-mod    Electrical Stimulation Parameters 80-150 hz x10 min    Electrical Stimulation Goals Tone;Pain      Ultrasound   Ultrasound Location R deltoids    Ultrasound Parameters Combo 1.5 w/cm2, 100%, 1 mhz x10 min    Ultrasound Goals Pain      Iontophoresis   Type of Iontophoresis Dexamethasone    Location R deltoids    Dose 1.0 ml    Time 8      Manual Therapy   Manual Therapy Myofascial release    Myofascial Release IASTW to R deltoids to reduce tone and pain                       PT Long Term Goals - 03/09/20 1703      PT LONG TERM GOAL #1   Title Independent with a HEP.    Time 6    Period Weeks    Status Achieved  PT LONG TERM GOAL #2   Title Perform ADL's with pain not > 2/10.    Time 6    Period Weeks    Status On-going                 Plan - 03/09/20 1705    Clinical Impression Statement Patient presented in clinic with reports of only intermittant R shoulder pain and indicated along mid deltoid. Patient able to tolerate modalities and IASTW well with moderate redness response. Patient educated regarding iontophoresis and provided handout for further education. Patient educated to remove patch in four hours.    Personal Factors and Comorbidities Comorbidity 1    Comorbidities Silicosis.    Examination-Activity Limitations Other    Examination-Participation Restrictions Other    Stability/Clinical Decision Making Stable/Uncomplicated    Rehab Potential Excellent    PT Frequency 2x / week    PT Duration 6 weeks    PT Treatment/Interventions ADLs/Self Care Home Management;Cryotherapy;Electrical Stimulation;Ultrasound;Moist Heat;Iontophoresis 4mg /ml  Dexamethasone;Therapeutic exercise;Therapeutic activities;Manual techniques;Patient/family education;Dry needling;Vasopneumatic Device    PT Next Visit Plan Combo e'stim/US to patient's right posterior shoulder; STW/M, RW4, [facility] ER.    Consulted and Agree with Plan of Care Patient           Patient will benefit from skilled therapeutic intervention in order to improve the following deficits and impairments:  Pain, Decreased activity tolerance, Decreased strength  Visit Diagnosis: Acute pain of right shoulder     Problem List Patient Active Problem List   Diagnosis Date Noted  . Acute cholecystitis 08/07/2015  . DEVIATED NASAL SEPTUM 08/26/2008  . ALLERGIC RHINITIS 08/26/2008  . SILICOSIS 59/74/7185  . COUGH 08/26/2008    Standley Brooking, PTA 03/09/2020, 5:49 PM  [facility] [address] [place], [place], [address] Phone: [phone]   Fax:  [phone]  Name: Jason Hill MRN: [id number] Date of Birth: [birth year]

## 2020-03-11 ENCOUNTER — Other Ambulatory Visit: Payer: Self-pay

## 2020-03-11 ENCOUNTER — Ambulatory Visit: Payer: Medicare HMO | Admitting: Physical Therapy

## 2020-03-11 ENCOUNTER — Encounter: Payer: Self-pay | Admitting: Physical Therapy

## 2020-03-11 DIAGNOSIS — M25511 Pain in right shoulder: Secondary | ICD-10-CM

## 2020-03-11 NOTE — Therapy (Signed)
[facility] [place], [place], [address] Phone: [phone]   Fax:  [phone]  Physical Therapy Treatment  Patient Details  Name: Jason Hill MRN: [id number] Date of Birth: February 22, 1941 Referring Provider (PT): Grier Mitts PA-C.   Encounter Date: 03/11/2020   PT End of Session - 03/11/20 0810    Visit Number 6    Number of Visits 12    Date for PT Re-Evaluation 04/05/20    PT Start Time 0730    PT Stop Time 0818    PT Time Calculation (min) 48 min    Activity Tolerance Patient tolerated treatment well    Behavior During Therapy [facility] for tasks assessed/performed           Past Medical History:  Diagnosis Date  . History of kidney stones   . Pneumonia   . Silicosis [facility])     Past Surgical History:  Procedure Laterality Date  . CHOLECYSTECTOMY  08/09/2015  . CHOLECYSTECTOMY N/A 08/07/2015   Procedure: LAPAROSCOPIC CHOLECYSTECTOMY WITH POSSIBLE INTRAOPERATIVE CHOLANGIOGRAM;  Surgeon: Coralie Keens, MD;  Location: [place];  Service: General;  Laterality: N/A;  . COLONOSCOPY WITH PROPOFOL N/A 04/18/2016   Procedure: COLONOSCOPY WITH PROPOFOL;  Surgeon: Garlan Fair, MD;  [facility];  Service: Endoscopy;  Laterality: N/A;    There were no vitals filed for this visit.   Subjective Assessment - 03/11/20 0810    Subjective COVID-19 screen performed prior to patient entering clinic. Reports doing well today and states PT has been helping.    Pertinent History Silicosis.    Patient Stated Goals Use right arm without pain.    Currently in Pain? No/denies              [facility] PT Assessment - 03/11/20 0001      Assessment   Medical Diagnosis Right shoulder tendinosis.    Referring Provider (PT) Grier Mitts PA-C.    Next MD Visit None                         [facility] Adult PT Treatment/Exercise - 03/11/20 0001      Modalities   Modalities Electrical  Stimulation;Ultrasound;Iontophoresis;Moist Heat      Moist Heat Therapy   Number Minutes Moist Heat 15 Minutes    Moist Heat Location Shoulder      Electrical Stimulation   Electrical Stimulation Location R deltoid and posterior cuff    Electrical Stimulation Action pre-mod    Electrical Stimulation Parameters 80-150 hz x15 mins    Electrical Stimulation Goals Tone;Pain      Ultrasound   Ultrasound Location R posterior cuff and delotid    Ultrasound Parameters combo 1.5 w/cm2 100% 1.5 w/cm2 x12 mins    Ultrasound Goals Pain      Manual Therapy   Manual Therapy Myofascial release    Myofascial Release IASTW to R deltoids and posterior cuff to reduce tone and pain                       PT Long Term Goals - 03/09/20 1703      PT LONG TERM GOAL #1   Title Independent with a HEP.    Time 6    Period Weeks    Status Achieved      PT LONG TERM GOAL #2   Title Perform ADL's with pain not > 2/10.    Time 6    Period Weeks  Status On-going                 Plan - 03/11/20 7371    Clinical Impression Statement Patient responded well to therapy session with no reports of increased pain. Patient noted with good response to IASTM with mild petechiae and erethyma. No adverse effects upon removal of modalities.    Personal Factors and Comorbidities Comorbidity 1    Comorbidities Silicosis.    Examination-Activity Limitations Other    Examination-Participation Restrictions Other    Stability/Clinical Decision Making Stable/Uncomplicated    Clinical Decision Making Low    Rehab Potential Excellent    PT Frequency 2x / week    PT Duration 6 weeks    PT Treatment/Interventions ADLs/Self Care Home Management;Cryotherapy;Electrical Stimulation;Ultrasound;Moist Heat;Iontophoresis 4mg /ml Dexamethasone;Therapeutic exercise;Therapeutic activities;Manual techniques;Patient/family education;Dry needling;Vasopneumatic Device    PT Next Visit Plan Combo e'stim/US to  patient's right posterior shoulder; STW/M, RW4, [facility] ER.    Consulted and Agree with Plan of Care Patient           Patient will benefit from skilled therapeutic intervention in order to improve the following deficits and impairments:  Pain, Decreased activity tolerance, Decreased strength  Visit Diagnosis: Acute pain of right shoulder     Problem List Patient Active Problem List   Diagnosis Date Noted  . Acute cholecystitis 08/07/2015  . DEVIATED NASAL SEPTUM 08/26/2008  . ALLERGIC RHINITIS 08/26/2008  . SILICOSIS 12/27/9483  . COUGH 08/26/2008    [name], PT, DPT 03/11/2020, 9:05 AM  [facility] [address] [place], [place], [address] Phone: [phone]   Fax:  [phone]  Name: Jason Hill MRN: [id number] Date of Birth: March 04, 1941

## 2020-03-16 ENCOUNTER — Other Ambulatory Visit: Payer: Self-pay

## 2020-03-16 ENCOUNTER — Ambulatory Visit: Payer: Medicare HMO | Admitting: Physical Therapy

## 2020-03-16 DIAGNOSIS — M25511 Pain in right shoulder: Secondary | ICD-10-CM

## 2020-03-16 NOTE — Therapy (Signed)
[facility] [place], [place], [address] Phone: [phone]   Fax:  [phone]  Physical Therapy Treatment  Patient Details  Name: ROCKWELL ZENTZ MRN: [id number] Date of Birth: [birth year] Referring Provider (PT): Grier Mitts PA-C.   Encounter Date: 03/16/2020   PT End of Session - 03/16/20 0824    Visit Number 7    Number of Visits 12    Date for PT Re-Evaluation 04/05/20    PT Start Time 0730    PT Stop Time 0820    PT Time Calculation (min) 50 min    Activity Tolerance Patient tolerated treatment well    Behavior During Therapy [facility] for tasks assessed/performed           Past Medical History:  Diagnosis Date  . History of kidney stones   . Pneumonia   . Silicosis [facility])     Past Surgical History:  Procedure Laterality Date  . CHOLECYSTECTOMY  08/09/2015  . CHOLECYSTECTOMY N/A 08/07/2015   Procedure: LAPAROSCOPIC CHOLECYSTECTOMY WITH POSSIBLE INTRAOPERATIVE CHOLANGIOGRAM;  Surgeon: Coralie Keens, MD;  Location: [place];  Service: General;  Laterality: N/A;  . COLONOSCOPY WITH PROPOFOL N/A 04/18/2016   Procedure: COLONOSCOPY WITH PROPOFOL;  Surgeon: Garlan Fair, MD;  [facility];  Service: Endoscopy;  Laterality: N/A;    There were no vitals filed for this visit.   Subjective Assessment - 03/16/20 0822    Subjective COVID-19 screen performed prior to patient entering clinic. Patient reports doing well. Able to play 9 holes of golf with no pain during but some anterior shoulder pain.    Pertinent History Silicosis.    Patient Stated Goals Use right arm without pain.    Currently in Pain? Yes   "little bit of soreness"             [facility] PT Assessment - 03/16/20 0001      Assessment   Medical Diagnosis Right shoulder tendinosis.    Referring Provider (PT) Grier Mitts PA-C.    Next MD Visit None      Precautions   Precautions None                          [facility] Adult PT Treatment/Exercise - 03/16/20 0001      Shoulder Exercises: ROM/Strengthening   UBE (Upper Arm Bike) 120 RPM x8 mins (4 fwd, 4 bwd)      Moist Heat Therapy   Number Minutes Moist Heat 10 Minutes    Moist Heat Location Shoulder      Electrical Stimulation   Electrical Stimulation Location R deltoid and posterior cuff    Electrical Stimulation Action pre-mod    Electrical Stimulation Parameters 80-150 hz x10 mins    Electrical Stimulation Goals Tone;Pain      Ultrasound   Ultrasound Location R anterior shoulder    Ultrasound Parameters combo 1.5 w/cm2 100% 1 mhz x10 mins    Ultrasound Goals Pain      Manual Therapy   Manual Therapy Soft tissue mobilization    Myofascial Release STW/M to R deltoids and posterior shoulder to decrease tone an pain                       PT Long Term Goals - 03/09/20 1703      PT LONG TERM GOAL #1   Title Independent with a HEP.    Time 6    Period Weeks  Status Achieved      PT LONG TERM GOAL #2   Title Perform ADL's with pain not > 2/10.    Time 6    Period Weeks    Status On-going                 Plan - 03/16/20 3419    Clinical Impression Statement Patient responded well to therapy session with addition of of UBE. Patient reported more discomfort with backwards UBE than foward but overall did well. Patient responded well to combo with reports of this modality helping a lot with discomfort and pain. Patient still with slight tone in R deltoid with STW/M. Normal response to modalities upon removal.    Personal Factors and Comorbidities Comorbidity 1    Comorbidities Silicosis.    Examination-Activity Limitations Other    Examination-Participation Restrictions Other    Stability/Clinical Decision Making Stable/Uncomplicated    Clinical Decision Making Low    Rehab Potential Excellent    PT Frequency 2x / week    PT Duration 6 weeks    PT Treatment/Interventions ADLs/Self Care Home  Management;Cryotherapy;Electrical Stimulation;Ultrasound;Moist Heat;Iontophoresis 4mg /ml Dexamethasone;Therapeutic exercise;Therapeutic activities;Manual techniques;Patient/family education;Dry needling;Vasopneumatic Device    PT Next Visit Plan Combo e'stim/US to patient's right posterior shoulder; STW/M, RW4, [facility] ER.    Consulted and Agree with Plan of Care Patient           Patient will benefit from skilled therapeutic intervention in order to improve the following deficits and impairments:  Pain, Decreased activity tolerance, Decreased strength  Visit Diagnosis: Acute pain of right shoulder     Problem List Patient Active Problem List   Diagnosis Date Noted  . Acute cholecystitis 08/07/2015  . DEVIATED NASAL SEPTUM 08/26/2008  . ALLERGIC RHINITIS 08/26/2008  . SILICOSIS 37/90/2409  . COUGH 08/26/2008    [name], PT, DPT 03/16/2020, 8:27 AM  [facility] [address] [place], [place], [address] Phone: [phone]   Fax:  [phone]  Name: MIZRAIM HARMENING MRN: [id number] Date of Birth: Jan 15, 1941

## 2020-03-18 ENCOUNTER — Ambulatory Visit: Payer: Medicare HMO | Admitting: Physical Therapy

## 2020-03-18 ENCOUNTER — Other Ambulatory Visit: Payer: Self-pay

## 2020-03-18 DIAGNOSIS — M25511 Pain in right shoulder: Secondary | ICD-10-CM

## 2020-03-18 NOTE — Therapy (Signed)
[facility] [place], [place], [address] Phone: [phone]   Fax:  [phone]  Physical Therapy Treatment  Patient Details  Name: Jason Hill MRN: [id number] Date of Birth: [birth year] Referring Provider (PT): Grier Mitts PA-C.   Encounter Date: 03/18/2020   PT End of Session - 03/18/20 0819    Visit Number 8    Number of Visits 12    Date for PT Re-Evaluation 04/05/20    PT Start Time 0730    PT Stop Time 0820    PT Time Calculation (min) 50 min    Activity Tolerance Patient tolerated treatment well    Behavior During Therapy [facility] for tasks assessed/performed           Past Medical History:  Diagnosis Date  . History of kidney stones   . Pneumonia   . Silicosis [facility])     Past Surgical History:  Procedure Laterality Date  . CHOLECYSTECTOMY  08/09/2015  . CHOLECYSTECTOMY N/A 08/07/2015   Procedure: LAPAROSCOPIC CHOLECYSTECTOMY WITH POSSIBLE INTRAOPERATIVE CHOLANGIOGRAM;  Surgeon: Coralie Keens, MD;  Location: [place];  Service: General;  Laterality: N/A;  . COLONOSCOPY WITH PROPOFOL N/A 04/18/2016   Procedure: COLONOSCOPY WITH PROPOFOL;  Surgeon: Garlan Fair, MD;  [facility];  Service: Endoscopy;  Laterality: N/A;    There were no vitals filed for this visit.   Subjective Assessment - 03/18/20 0818    Subjective COVID-19 screen performed prior to patient entering clinic. Patient reports some soreness in middle deltoid region. States he did not have any pain playing 18 holes but soreness later.    Pertinent History Silicosis.    Patient Stated Goals Use right arm without pain.    Currently in Pain? Yes   did not provide number on pain scale             [facility] PT Assessment - 03/18/20 0001      Assessment   Medical Diagnosis Right shoulder tendinosis.    Referring Provider (PT) Grier Mitts PA-C.    Next MD Visit None      Precautions   Precautions None                          [facility] Adult PT Treatment/Exercise - 03/18/20 0001      Shoulder Exercises: ROM/Strengthening   UBE (Upper Arm Bike) 120 RPM x8 mins (4 fwd, 4 bwd)      Modalities   Modalities Electrical Stimulation;Ultrasound;Iontophoresis;Moist Heat      Moist Heat Therapy   Number Minutes Moist Heat 10 Minutes    Moist Heat Location Shoulder      Electrical Stimulation   Electrical Stimulation Location R deltoid and biceps region    Electrical Stimulation Action pre-mod    Electrical Stimulation Parameters 80-150 hz x10 mins    Electrical Stimulation Goals Tone;Pain      Ultrasound   Ultrasound Location R delotid region    Ultrasound Parameters combo 1.5 w/cm2, 100%, 1 mhz x10 mins    Ultrasound Goals Pain      Manual Therapy   Manual Therapy Soft tissue mobilization    Myofascial Release STW/M to R deltoids and biceps region to decrease tone an pain                       PT Long Term Goals - 03/09/20 1703      PT LONG TERM GOAL #1  Title Independent with a HEP.    Time 6    Period Weeks    Status Achieved      PT LONG TERM GOAL #2   Title Perform ADL's with pain not > 2/10.    Time 6    Period Weeks    Status On-going                 Plan - 03/18/20 0819    Clinical Impression Statement Patient responded well to therapy session but with ongoing discomfort with backward revolutions in UBE. More prominent trigger point noted in middle deltoid which released after STW/M and Combo. Patient and PT discussed progressing TEs for RTC strengthening next visit. Normal response to modalities upon removal.    Personal Factors and Comorbidities Comorbidity 1    Comorbidities Silicosis.    Examination-Activity Limitations Other    Examination-Participation Restrictions Other    Stability/Clinical Decision Making Stable/Uncomplicated    Clinical Decision Making Low    Rehab Potential Excellent    PT Frequency 2x / week    PT Duration 6  weeks    PT Treatment/Interventions ADLs/Self Care Home Management;Cryotherapy;Electrical Stimulation;Ultrasound;Moist Heat;Iontophoresis 4mg /ml Dexamethasone;Therapeutic exercise;Therapeutic activities;Manual techniques;Patient/family education;Dry needling;Vasopneumatic Device    PT Next Visit Plan Combo e'stim/US to patient's right posterior shoulder; STW/M, RW4, [facility] ER.    Consulted and Agree with Plan of Care Patient           Patient will benefit from skilled therapeutic intervention in order to improve the following deficits and impairments:  Pain, Decreased activity tolerance, Decreased strength  Visit Diagnosis: Acute pain of right shoulder     Problem List Patient Active Problem List   Diagnosis Date Noted  . Acute cholecystitis 08/07/2015  . DEVIATED NASAL SEPTUM 08/26/2008  . ALLERGIC RHINITIS 08/26/2008  . SILICOSIS 06/77/0340  . COUGH 08/26/2008    Jason Hill, PT, DPT 03/18/2020, 9:09 AM  [facility] [address] [place], [place], [address] Phone: [phone]   Fax:  [phone]  Name: Jason Hill MRN: [id number] Date of Birth: October 11, 1940

## 2020-03-23 ENCOUNTER — Encounter: Payer: Self-pay | Admitting: Physical Therapy

## 2020-03-23 ENCOUNTER — Ambulatory Visit: Payer: Medicare HMO | Admitting: Physical Therapy

## 2020-03-23 ENCOUNTER — Other Ambulatory Visit: Payer: Self-pay

## 2020-03-23 DIAGNOSIS — M25511 Pain in right shoulder: Secondary | ICD-10-CM

## 2020-03-23 NOTE — Therapy (Signed)
[facility] [place], [place], [address] Phone: [phone]   Fax:  [phone]  Physical Therapy Treatment  Patient Details  Name: Jason Hill MRN: [id number] Date of Birth: Jul 04, 1940 Referring Provider (PT): Grier Mitts PA-C.   Encounter Date: 03/23/2020   PT End of Session - 03/23/20 1950    Visit Number 9    Number of Visits 12    Date for PT Re-Evaluation 04/05/20    PT Start Time 0730    PT Stop Time 0819    PT Time Calculation (min) 49 min    Activity Tolerance Patient tolerated treatment well    Behavior During Therapy [facility] for tasks assessed/performed           Past Medical History:  Diagnosis Date  . History of kidney stones   . Pneumonia   . Silicosis [facility])     Past Surgical History:  Procedure Laterality Date  . CHOLECYSTECTOMY  08/09/2015  . CHOLECYSTECTOMY N/A 08/07/2015   Procedure: LAPAROSCOPIC CHOLECYSTECTOMY WITH POSSIBLE INTRAOPERATIVE CHOLANGIOGRAM;  Surgeon: Coralie Keens, MD;  Location: [place];  Service: General;  Laterality: N/A;  . COLONOSCOPY WITH PROPOFOL N/A 04/18/2016   Procedure: COLONOSCOPY WITH PROPOFOL;  Surgeon: Garlan Fair, MD;  [facility];  Service: Endoscopy;  Laterality: N/A;    There were no vitals filed for this visit.   Subjective Assessment - 03/23/20 0811    Subjective COVID-19 screen performed prior to patient entering clinic. Patient reports feeling good today. Is able to perform 25 reps of RW4 with red theraband with no complaints during HEP    Pertinent History Silicosis.    Patient Stated Goals Use right arm without pain.    Currently in Pain? No/denies              [facility] PT Assessment - 03/23/20 0001      Assessment   Medical Diagnosis Right shoulder tendinosis.    Referring Provider (PT) Grier Mitts PA-C.    Next MD Visit None      Precautions   Precautions None                         [facility] Adult  PT Treatment/Exercise - 03/23/20 0001      Shoulder Exercises: Standing   Extension Strengthening;20 reps;10 reps;Both;Other (comment)    Extension Limitations Blue XTS    Diagonals Strengthening;20 reps;10 reps;Both;Other (comment)    Diagonals Limitations Chop/Lift Blue XTS    Other Standing Exercises Mid Rows x30 Blue XTS      Shoulder Exercises: ROM/Strengthening   UBE (Upper Arm Bike) 90 RPM x8 mins (4 fwd, 4 bwd)      Modalities   Modalities Electrical Stimulation;Ultrasound;Iontophoresis;Moist Heat      Moist Heat Therapy   Number Minutes Moist Heat 10 Minutes    Moist Heat Location Shoulder      Electrical Stimulation   Electrical Stimulation Location R deltoid and biceps region    Electrical Stimulation Action pre-mod    Electrical Stimulation Parameters 80-150 hz x10 mins    Electrical Stimulation Goals Tone;Pain      Ultrasound   Ultrasound Location R delotid and anterior shoulder    Ultrasound Parameters combo 1.5 w/cm2 100%, 1 mhz, x10 mins    Ultrasound Goals Pain                       PT Long Term Goals - 03/09/20 1703  PT LONG TERM GOAL #1   Title Independent with a HEP.    Time 6    Period Weeks    Status Achieved      PT LONG TERM GOAL #2   Title Perform ADL's with pain not > 2/10.    Time 6    Period Weeks    Status On-going                 Plan - 03/23/20 2836    Clinical Impression Statement Patient responded well to the addition of TEs with minimal complaints with exception of fatigue. Patient guided through TEs for form and technique with good response. Combo performed with no adverse effects and reports of decrease of fatigue and discomfort at end of modality. Normal response upon removal of modalities.    Personal Factors and Comorbidities Comorbidity 1    Comorbidities Silicosis.    Examination-Activity Limitations Other    Examination-Participation Restrictions Other    Stability/Clinical Decision Making  Stable/Uncomplicated    Clinical Decision Making Low    Rehab Potential Excellent    PT Frequency 2x / week    PT Duration 6 weeks    PT Treatment/Interventions ADLs/Self Care Home Management;Cryotherapy;Electrical Stimulation;Ultrasound;Moist Heat;Iontophoresis 4mg /ml Dexamethasone;Therapeutic exercise;Therapeutic activities;Manual techniques;Patient/family education;Dry needling;Vasopneumatic Device    PT Next Visit Plan Combo e'stim/US to patient's right posterior shoulder; STW/M, RW4, [facility] ER.    Consulted and Agree with Plan of Care Patient           Patient will benefit from skilled therapeutic intervention in order to improve the following deficits and impairments:  Pain, Decreased activity tolerance, Decreased strength  Visit Diagnosis: Acute pain of right shoulder     Problem List Patient Active Problem List   Diagnosis Date Noted  . Acute cholecystitis 08/07/2015  . DEVIATED NASAL SEPTUM 08/26/2008  . ALLERGIC RHINITIS 08/26/2008  . SILICOSIS 62/94/7654  . COUGH 08/26/2008    [name], PT, DPT 03/23/2020, 8:25 AM  [facility] [address] [place], [place], [address] Phone: [phone]   Fax:  [phone]  Name: Jason Hill MRN: [id number] Date of Birth: 11-20-1940

## 2020-03-25 ENCOUNTER — Other Ambulatory Visit: Payer: Self-pay

## 2020-03-25 ENCOUNTER — Encounter: Payer: Self-pay | Admitting: Physical Therapy

## 2020-03-25 ENCOUNTER — Ambulatory Visit: Payer: Medicare HMO | Admitting: Physical Therapy

## 2020-03-25 DIAGNOSIS — M25511 Pain in right shoulder: Secondary | ICD-10-CM

## 2020-03-25 NOTE — Therapy (Addendum)
[facility] [place], [place], [address] Phone: [phone]   Fax:  [phone]  Physical Therapy Treatment Progress Note Reporting Period 02/23/2020 to 03/25/2020  See note below for Objective Data and Assessment of Progress/Goals. Patient responding well with reports of 80% improvement since start of therapy. [name], PT, DPT     Patient Details  Name: Jason Hill MRN: [id number] Date of Birth: 08-Jun-1941 Referring Provider (PT): Grier Mitts PA-C.   Encounter Date: 03/25/2020   PT End of Session - 03/25/20 0739    Visit Number 10    Number of Visits 12    Date for PT Re-Evaluation 04/05/20    PT Start Time 0733    Activity Tolerance Patient tolerated treatment well    Behavior During Therapy [facility] for tasks assessed/performed           Past Medical History:  Diagnosis Date  . History of kidney stones   . Pneumonia   . Silicosis [facility])     Past Surgical History:  Procedure Laterality Date  . CHOLECYSTECTOMY  08/09/2015  . CHOLECYSTECTOMY N/A 08/07/2015   Procedure: LAPAROSCOPIC CHOLECYSTECTOMY WITH POSSIBLE INTRAOPERATIVE CHOLANGIOGRAM;  Surgeon: Coralie Keens, MD;  Location: [place];  Service: General;  Laterality: N/A;  . COLONOSCOPY WITH PROPOFOL N/A 04/18/2016   Procedure: COLONOSCOPY WITH PROPOFOL;  Surgeon: Garlan Fair, MD;  [facility];  Service: Endoscopy;  Laterality: N/A;    There were no vitals filed for this visit.   Subjective Assessment - 03/25/20 0737    Subjective COVID-19 screen performed prior to patient entering clinic. Patient reports feeling good today with no pain but did have some pain yesterday.    Pertinent History Silicosis.    Patient Stated Goals Use right arm without pain.    Currently in Pain? No/denies              [facility] PT Assessment - 03/25/20 0001      Assessment   Medical Diagnosis Right shoulder tendinosis.    Referring Provider  (PT) Grier Mitts PA-C.    Next MD Visit None      Precautions   Precautions None                         [facility] Adult PT Treatment/Exercise - 03/25/20 0001      Shoulder Exercises: Standing   Protraction Strengthening;Right;20 reps;Theraband    Theraband Level (Shoulder Protraction) Level 2 (Red)    External Rotation Strengthening;Right;20 reps;Theraband    Theraband Level (Shoulder External Rotation) Level 2 (Red)    Internal Rotation Strengthening;Right;20 reps;Theraband    Theraband Level (Shoulder Internal Rotation) Level 2 (Red)    Extension Strengthening;20 reps;10 reps;Both;Other (comment)    Extension Limitations Blue XTS    Row Strengthening;Both;20 reps;10 reps;Limitations    Row Limitations Blue XTS    Diagonals Strengthening;20 reps;10 reps;Both;Other (comment)    Diagonals Limitations Chop/Lift Blue XTS      Shoulder Exercises: ROM/Strengthening   UBE (Upper Arm Bike) 90 RPM x8 mins (4 fwd, 4 bwd)    Wall Pushups 20 reps    Other ROM/Strengthening Exercises wall walks red theraband x1 RT      Modalities   Modalities Electrical Stimulation;Ultrasound      Electrical Stimulation   Electrical Stimulation Location R posterior shoulder, deltoids    Electrical Stimulation Action Pre-Mod    Electrical Stimulation Parameters 80-150 hz x10 min    Electrical Stimulation Goals Pain  Ultrasound   Ultrasound Location R posterior deltoid    Ultrasound Parameters Combo 1.5 w/cm2, 100%, 1 mhz x10 min    Ultrasound Goals Pain                       PT Long Term Goals - 03/09/20 1703      PT LONG TERM GOAL #1   Title Independent with a HEP.    Time 6    Period Weeks    Status Achieved      PT LONG TERM GOAL #2   Title Perform ADL's with pain not > 2/10.    Time 6    Period Weeks    Status On-going                 Plan - 03/25/20 0807    Clinical Impression Statement Patient presented in clinic with only minimal but  intermittant R posterior shoulder. Patient progressed through more shoulder stability and strengthening exercises with no complaints of pain. Intermittant VCs and tactile cues provided to correct exercise technique such as trunk rotation. Patient could indicate one particular area of posterior deltoid, tricep region that was uncomfortable with deep palpation. Normal modalities response noted following removal of the modalities. Patient concludes he is approximately 80% improved and has none to minimal pain with golfing activities.    Personal Factors and Comorbidities Comorbidity 1    Comorbidities Silicosis.    Examination-Activity Limitations Other    Examination-Participation Restrictions Other    Stability/Clinical Decision Making Stable/Uncomplicated    Rehab Potential Excellent    PT Frequency 2x / week    PT Duration 6 weeks    PT Treatment/Interventions ADLs/Self Care Home Management;Cryotherapy;Electrical Stimulation;Ultrasound;Moist Heat;Iontophoresis 4mg /ml Dexamethasone;Therapeutic exercise;Therapeutic activities;Manual techniques;Patient/family education;Dry needling;Vasopneumatic Device    PT Next Visit Plan Continue progressive strengthening as indicated. Prepare HEP for D/C next week.    Consulted and Agree with Plan of Care Patient           Patient will benefit from skilled therapeutic intervention in order to improve the following deficits and impairments:  Pain, Decreased activity tolerance, Decreased strength  Visit Diagnosis: Acute pain of right shoulder     Problem List Patient Active Problem List   Diagnosis Date Noted  . Acute cholecystitis 08/07/2015  . DEVIATED NASAL SEPTUM 08/26/2008  . ALLERGIC RHINITIS 08/26/2008  . SILICOSIS 15/40/0867  . COUGH 08/26/2008    Standley Brooking, PTA 03/25/2020, 8:24 AM  [facility] [address] [place], [place], [address] Phone: [phone]   Fax:  [phone]  Name:  Jason Hill MRN: [id number] Date of Birth: 12/04/1940

## 2020-03-30 ENCOUNTER — Ambulatory Visit: Payer: Medicare HMO | Admitting: Physical Therapy

## 2020-03-30 ENCOUNTER — Other Ambulatory Visit: Payer: Self-pay

## 2020-03-30 ENCOUNTER — Encounter: Payer: Self-pay | Admitting: Physical Therapy

## 2020-03-30 DIAGNOSIS — M25511 Pain in right shoulder: Secondary | ICD-10-CM | POA: Diagnosis not present

## 2020-03-30 NOTE — Therapy (Signed)
[facility] [place], [place], [address] Phone: [phone]   Fax:  [phone]  Physical Therapy Treatment  Patient Details  Name: Jason Hill MRN: [id number] Date of Birth: 12-04-1940 Referring Provider (PT): Grier Mitts PA-C.   Encounter Date: 03/30/2020   PT End of Session - 03/30/20 0744    Visit Number 11    Number of Visits 12    Date for PT Re-Evaluation 04/05/20    PT Start Time 0735    PT Stop Time 0816    PT Time Calculation (min) 41 min    Activity Tolerance Patient tolerated treatment well    Behavior During Therapy [facility] for tasks assessed/performed           Past Medical History:  Diagnosis Date  . History of kidney stones   . Pneumonia   . Silicosis [facility])     Past Surgical History:  Procedure Laterality Date  . CHOLECYSTECTOMY  08/09/2015  . CHOLECYSTECTOMY N/A 08/07/2015   Procedure: LAPAROSCOPIC CHOLECYSTECTOMY WITH POSSIBLE INTRAOPERATIVE CHOLANGIOGRAM;  Surgeon: Coralie Keens, MD;  Location: [place];  Service: General;  Laterality: N/A;  . COLONOSCOPY WITH PROPOFOL N/A 04/18/2016   Procedure: COLONOSCOPY WITH PROPOFOL;  Surgeon: Garlan Fair, MD;  Location: [facility];  Service: Endoscopy;  Laterality: N/A;    There were no vitals filed for this visit.   Subjective Assessment - 03/30/20 0736    Subjective COVID-19 screen performed prior to patient entering clinic. Patient reports feeling good today with only minimal intermittant pain. Patient has no issues while playing golf.    Pertinent History Silicosis.    Patient Stated Goals Use right arm without pain.    Currently in Pain? No/denies              [facility] PT Assessment - 03/30/20 0001      Assessment   Medical Diagnosis Right shoulder tendinosis.    Referring Provider (PT) Grier Mitts PA-C.    Next MD Visit None      Precautions   Precautions None                         [facility] Adult PT  Treatment/Exercise - 03/30/20 0001      Shoulder Exercises: Standing   Protraction Strengthening;Right;20 reps;10 reps;Theraband    Theraband Level (Shoulder Protraction) Level 2 (Red)    Horizontal ABduction Strengthening;Both;10 reps;Theraband    Theraband Level (Shoulder Horizontal ABduction) Level 2 (Red)    External Rotation Strengthening;Right;20 reps;10 reps;Theraband    Theraband Level (Shoulder External Rotation) Level 2 (Red)    Internal Rotation Strengthening;Right;20 reps;10 reps;Theraband    Theraband Level (Shoulder Internal Rotation) Level 2 (Red)    Flexion Strengthening;Right;20 reps;Weights    Shoulder Flexion Weight (lbs) 2    ABduction Strengthening;Right;20 reps;Weights    Shoulder ABduction Weight (lbs) 2    Extension Strengthening;20 reps;10 reps;Both;Other (comment)    Extension Limitations Blue XTS    Row Strengthening;Both;20 reps;10 reps;Limitations    Theraband Level (Shoulder Row) Level 2 (Red)    Diagonals Strengthening;20 reps;10 reps;Both;Other (comment)    Diagonals Limitations Chop/Lift Blue XTS    Other Standing Exercises R shoulder 90/90 ER strengthening 3# x30 reps    Other Standing Exercises R shoulder scaption 2# x20 reps; R shoulder D1 and D2 red theraband x10 reps each      Shoulder Exercises: ROM/Strengthening   UBE (Upper Arm Bike) 60 RPM x8 mins (4 fwd, 4 bwd)  Modalities   Modalities Vasopneumatic      Vasopneumatic   Number Minutes Vasopneumatic  10 minutes    Vasopnuematic Location  Shoulder    Vasopneumatic Pressure Low    Vasopneumatic Temperature  34                       PT Long Term Goals - 03/09/20 1703      PT LONG TERM GOAL #1   Title Independent with a HEP.    Time 6    Period Weeks    Status Achieved      PT LONG TERM GOAL #2   Title Perform ADL's with pain not > 2/10.    Time 6    Period Weeks    Status On-going                 Plan - 03/30/20 0805    Clinical Impression Statement  Patient presented in clinic with reports of very intermittant R shoulder. Patient guided through more advanced shoulder strengthening with only intermittant discomfort and especially muscle fatigue by the end of therex. Intermittant multimodal cueing for proper technique to reduce trunk rotation. Patient denies any sleep limitations and can now sleep on R shoulder. Normal vasopnuematic response following removal to reduce post exercise fatigue and discomfort. Patient was educated regarding glenohumeral movement as well impingement syndrome per posters within clinic.    Personal Factors and Comorbidities Comorbidity 1    Comorbidities Silicosis.    Examination-Activity Limitations Other    Examination-Participation Restrictions Other    Stability/Clinical Decision Making Stable/Uncomplicated    Rehab Potential Excellent    PT Frequency 2x / week    PT Duration 6 weeks    PT Treatment/Interventions ADLs/Self Care Home Management;Cryotherapy;Electrical Stimulation;Ultrasound;Moist Heat;Iontophoresis 4mg /ml Dexamethasone;Therapeutic exercise;Therapeutic activities;Manual techniques;Patient/family education;Dry needling;Vasopneumatic Device    PT Next Visit Plan D/C summary required next visits.           Patient will benefit from skilled therapeutic intervention in order to improve the following deficits and impairments:  Pain, Decreased activity tolerance, Decreased strength  Visit Diagnosis: Acute pain of right shoulder     Problem List Patient Active Problem List   Diagnosis Date Noted  . Acute cholecystitis 08/07/2015  . DEVIATED NASAL SEPTUM 08/26/2008  . ALLERGIC RHINITIS 08/26/2008  . SILICOSIS 49/17/9150  . COUGH 08/26/2008    Standley Brooking, PTA 03/30/2020, 8:21 AM  [facility] [address] [place], [place], [address] Phone: [phone]   Fax:  [phone]  Name: Jason Hill MRN: [id number] Date of Birth: 13-Mar-1941

## 2020-04-01 ENCOUNTER — Encounter: Payer: Self-pay | Admitting: Physical Therapy

## 2020-04-01 ENCOUNTER — Other Ambulatory Visit: Payer: Self-pay

## 2020-04-01 ENCOUNTER — Ambulatory Visit: Payer: Medicare HMO | Admitting: Physical Therapy

## 2020-04-01 DIAGNOSIS — M25511 Pain in right shoulder: Secondary | ICD-10-CM | POA: Diagnosis not present

## 2020-04-01 NOTE — Therapy (Signed)
[facility] [place], [place], [address] Phone: [phone]   Fax:  [phone]  Physical Therapy Treatment  PHYSICAL THERAPY DISCHARGE SUMMARY  Visits from Start of Care: 12  Current functional level related to goals / functional outcomes: See below   Remaining deficits: See goals   Education / Equipment: HEP Plan: Patient agrees to discharge.  Patient goals were met. Patient is being discharged due to meeting the stated rehab goals.  ?????   [name], PT, DPT   Patient Details  Name: Jason Hill MRN: [id number] Date of Birth: 1940/08/28 Referring Provider (PT): Grier Mitts PA-C.   Encounter Date: 04/01/2020   PT End of Session - 04/01/20 0809    Visit Number 12    Number of Visits 12    Date for PT Re-Evaluation 04/05/20    PT Start Time 0732    PT Stop Time 0817    PT Time Calculation (min) 45 min    Activity Tolerance Patient tolerated treatment well    Behavior During Therapy [facility] for tasks assessed/performed           Past Medical History:  Diagnosis Date  . History of kidney stones   . Pneumonia   . Silicosis [facility])     Past Surgical History:  Procedure Laterality Date  . CHOLECYSTECTOMY  08/09/2015  . CHOLECYSTECTOMY N/A 08/07/2015   Procedure: LAPAROSCOPIC CHOLECYSTECTOMY WITH POSSIBLE INTRAOPERATIVE CHOLANGIOGRAM;  Surgeon: Coralie Keens, MD;  Location: [place];  Service: General;  Laterality: N/A;  . COLONOSCOPY WITH PROPOFOL N/A 04/18/2016   Procedure: COLONOSCOPY WITH PROPOFOL;  Surgeon: Garlan Fair, MD;  [facility];  Service: Endoscopy;  Laterality: N/A;    There were no vitals filed for this visit.   Subjective Assessment - 04/01/20 0744    Subjective COVID-19 screen performed prior to patient entering clinic. Patient reports feeling some discomfort in R deltoids.    Pertinent History Silicosis.    Patient Stated Goals Use right arm without pain.     Currently in Pain? Yes    Pain Score 3     Pain Location Shoulder    Pain Orientation Right    Pain Descriptors / Indicators Sore    Pain Type Acute pain    Pain Onset More than a month ago    Pain Frequency Constant              [facility] PT Assessment - 04/01/20 0001      Assessment   Medical Diagnosis Right shoulder tendinosis.    Referring Provider (PT) Grier Mitts PA-C.    Next MD Visit None      Precautions   Precautions None                         [facility] Adult PT Treatment/Exercise - 04/01/20 0001      Shoulder Exercises: Standing   Protraction Strengthening;Right;20 reps;10 reps;Theraband    Theraband Level (Shoulder Protraction) Level 2 (Red)    External Rotation Strengthening;Right;20 reps;10 reps;Theraband    Theraband Level (Shoulder External Rotation) Level 2 (Red)    Internal Rotation Strengthening;Right;20 reps;10 reps;Theraband    Theraband Level (Shoulder Internal Rotation) Level 2 (Red)    Extension Strengthening;20 reps;10 reps;Both;Other (comment)    Extension Limitations Blue XTS    Row Strengthening;Both;20 reps;10 reps;Limitations    Row Limitations Blue XTS      Shoulder Exercises: ROM/Strengthening   UBE (Upper Arm Bike) 60 RPM  x8 mins (4 fwd, 4 bwd)      Modalities   Modalities Electrical Stimulation;Ultrasound      Acupuncturist Location R shoulder    Electrical Stimulation Action Pre-Mod    Electrical Stimulation Parameters 80-150 hz x10 min    Electrical Stimulation Goals Pain      Ultrasound   Ultrasound Location R mid-anterior deltoid    Ultrasound Parameters Combo 1.5 w/cm2, 100%, 1 mhz x10 min    Ultrasound Goals Pain                       PT Long Term Goals - 04/01/20 0813      PT LONG TERM GOAL #1   Title Independent with a HEP.    Time 6    Period Weeks    Status Achieved      PT LONG TERM GOAL #2   Title Perform ADL's with pain not > 2/10.     Time 6    Period Weeks    Status Achieved                 Plan - 04/01/20 1478    Clinical Impression Statement Patient has progressed well since beginning PT as he predominately does not experience R shoulder pain with ADLs, recreational activities or therex. Patient did report a spike in soreness of R shoulder after cleaning deep bathtub yesterday as well as changing a faucet. Patient only indicated that pain was soreness located in R deltoids region. All goals met and HEP was highly encouraged to be continued. Normal modalities response noted following removal of the modalities.    Personal Factors and Comorbidities Comorbidity 1    Comorbidities Silicosis.    Examination-Activity Limitations Other    Examination-Participation Restrictions Other    Stability/Clinical Decision Making Stable/Uncomplicated    Rehab Potential Excellent    PT Frequency 2x / week    PT Duration 6 weeks    PT Treatment/Interventions ADLs/Self Care Home Management;Cryotherapy;Electrical Stimulation;Ultrasound;Moist Heat;Iontophoresis 49m/ml Dexamethasone;Therapeutic exercise;Therapeutic activities;Manual techniques;Patient/family education;Dry needling;Vasopneumatic Device    PT Next Visit Plan D/C summary required.    Consulted and Agree with Plan of Care Patient           Patient will benefit from skilled therapeutic intervention in order to improve the following deficits and impairments:  Pain, Decreased activity tolerance, Decreased strength  Visit Diagnosis: Acute pain of right shoulder     Problem List Patient Active Problem List   Diagnosis Date Noted  . Acute cholecystitis 08/07/2015  . DEVIATED NASAL SEPTUM 08/26/2008  . ALLERGIC RHINITIS 08/26/2008  . SILICOSIS 029/56/2130 . COUGH 08/26/2008    KStandley Brooking PTA 04/01/20 10:08 AM   [facility] [address] [address]Phone: [phone]  Fax:   [phone] Name: Jason TILLERYMRN: [id number] of Birth: 901/20/1942

## 2020-04-07 DIAGNOSIS — H524 Presbyopia: Secondary | ICD-10-CM | POA: Diagnosis not present

## 2020-04-07 DIAGNOSIS — Z0100 Encounter for examination of eyes and vision without abnormal findings: Secondary | ICD-10-CM | POA: Diagnosis not present

## 2020-09-02 DIAGNOSIS — E782 Mixed hyperlipidemia: Secondary | ICD-10-CM | POA: Diagnosis not present

## 2020-09-02 DIAGNOSIS — N1831 Chronic kidney disease, stage 3a: Secondary | ICD-10-CM | POA: Diagnosis not present

## 2020-09-02 DIAGNOSIS — I700 Atherosclerosis of aorta: Secondary | ICD-10-CM | POA: Diagnosis not present

## 2020-09-02 DIAGNOSIS — N400 Enlarged prostate without lower urinary tract symptoms: Secondary | ICD-10-CM | POA: Diagnosis not present

## 2020-09-02 DIAGNOSIS — J309 Allergic rhinitis, unspecified: Secondary | ICD-10-CM | POA: Diagnosis not present

## 2020-09-02 DIAGNOSIS — Z0000 Encounter for general adult medical examination without abnormal findings: Secondary | ICD-10-CM | POA: Diagnosis not present

## 2020-09-02 DIAGNOSIS — Z1389 Encounter for screening for other disorder: Secondary | ICD-10-CM | POA: Diagnosis not present

## 2020-09-02 DIAGNOSIS — J628 Pneumoconiosis due to other dust containing silica: Secondary | ICD-10-CM | POA: Diagnosis not present

## 2020-09-02 DIAGNOSIS — R7309 Other abnormal glucose: Secondary | ICD-10-CM | POA: Diagnosis not present

## 2020-10-14 DIAGNOSIS — L718 Other rosacea: Secondary | ICD-10-CM | POA: Diagnosis not present

## 2020-10-14 DIAGNOSIS — L821 Other seborrheic keratosis: Secondary | ICD-10-CM | POA: Diagnosis not present

## 2020-10-14 DIAGNOSIS — Z85828 Personal history of other malignant neoplasm of skin: Secondary | ICD-10-CM | POA: Diagnosis not present

## 2020-10-14 DIAGNOSIS — L570 Actinic keratosis: Secondary | ICD-10-CM | POA: Diagnosis not present

## 2020-10-14 DIAGNOSIS — L723 Sebaceous cyst: Secondary | ICD-10-CM | POA: Diagnosis not present

## 2021-06-12 DIAGNOSIS — U071 COVID-19: Secondary | ICD-10-CM | POA: Diagnosis not present

## 2021-06-12 DIAGNOSIS — J3489 Other specified disorders of nose and nasal sinuses: Secondary | ICD-10-CM | POA: Diagnosis not present

## 2021-06-12 DIAGNOSIS — R059 Cough, unspecified: Secondary | ICD-10-CM | POA: Diagnosis not present

## 2021-09-22 DIAGNOSIS — Z0000 Encounter for general adult medical examination without abnormal findings: Secondary | ICD-10-CM | POA: Diagnosis not present

## 2021-09-22 DIAGNOSIS — N200 Calculus of kidney: Secondary | ICD-10-CM | POA: Diagnosis not present

## 2021-09-22 DIAGNOSIS — J628 Pneumoconiosis due to other dust containing silica: Secondary | ICD-10-CM | POA: Diagnosis not present

## 2021-09-22 DIAGNOSIS — N1831 Chronic kidney disease, stage 3a: Secondary | ICD-10-CM | POA: Diagnosis not present

## 2021-09-22 DIAGNOSIS — J309 Allergic rhinitis, unspecified: Secondary | ICD-10-CM | POA: Diagnosis not present

## 2021-09-22 DIAGNOSIS — I700 Atherosclerosis of aorta: Secondary | ICD-10-CM | POA: Diagnosis not present

## 2021-09-22 DIAGNOSIS — R7303 Prediabetes: Secondary | ICD-10-CM | POA: Diagnosis not present

## 2021-09-22 DIAGNOSIS — N400 Enlarged prostate without lower urinary tract symptoms: Secondary | ICD-10-CM | POA: Diagnosis not present

## 2021-09-22 DIAGNOSIS — Z1389 Encounter for screening for other disorder: Secondary | ICD-10-CM | POA: Diagnosis not present

## 2021-09-22 DIAGNOSIS — E782 Mixed hyperlipidemia: Secondary | ICD-10-CM | POA: Diagnosis not present

## 2022-07-05 DIAGNOSIS — L720 Epidermal cyst: Secondary | ICD-10-CM | POA: Diagnosis not present

## 2022-07-05 DIAGNOSIS — D225 Melanocytic nevi of trunk: Secondary | ICD-10-CM | POA: Diagnosis not present

## 2022-07-05 DIAGNOSIS — D1801 Hemangioma of skin and subcutaneous tissue: Secondary | ICD-10-CM | POA: Diagnosis not present

## 2022-07-05 DIAGNOSIS — L218 Other seborrheic dermatitis: Secondary | ICD-10-CM | POA: Diagnosis not present

## 2022-07-05 DIAGNOSIS — D485 Neoplasm of uncertain behavior of skin: Secondary | ICD-10-CM | POA: Diagnosis not present

## 2022-07-05 DIAGNOSIS — L821 Other seborrheic keratosis: Secondary | ICD-10-CM | POA: Diagnosis not present

## 2022-07-05 DIAGNOSIS — L570 Actinic keratosis: Secondary | ICD-10-CM | POA: Diagnosis not present

## 2022-07-05 DIAGNOSIS — Z85828 Personal history of other malignant neoplasm of skin: Secondary | ICD-10-CM | POA: Diagnosis not present

## 2022-07-05 DIAGNOSIS — D044 Carcinoma in situ of skin of scalp and neck: Secondary | ICD-10-CM | POA: Diagnosis not present

## 2022-10-17 DIAGNOSIS — I499 Cardiac arrhythmia, unspecified: Secondary | ICD-10-CM | POA: Diagnosis not present

## 2022-10-17 DIAGNOSIS — I700 Atherosclerosis of aorta: Secondary | ICD-10-CM | POA: Diagnosis not present

## 2022-10-17 DIAGNOSIS — N1831 Chronic kidney disease, stage 3a: Secondary | ICD-10-CM | POA: Diagnosis not present

## 2022-10-17 DIAGNOSIS — E782 Mixed hyperlipidemia: Secondary | ICD-10-CM | POA: Diagnosis not present

## 2022-10-17 DIAGNOSIS — Z0000 Encounter for general adult medical examination without abnormal findings: Secondary | ICD-10-CM | POA: Diagnosis not present

## 2022-10-17 DIAGNOSIS — R7303 Prediabetes: Secondary | ICD-10-CM | POA: Diagnosis not present

## 2022-10-17 DIAGNOSIS — N400 Enlarged prostate without lower urinary tract symptoms: Secondary | ICD-10-CM | POA: Diagnosis not present

## 2022-10-17 DIAGNOSIS — J309 Allergic rhinitis, unspecified: Secondary | ICD-10-CM | POA: Diagnosis not present

## 2022-10-17 DIAGNOSIS — J628 Pneumoconiosis due to other dust containing silica: Secondary | ICD-10-CM | POA: Diagnosis not present

## 2023-07-25 DIAGNOSIS — J628 Pneumoconiosis due to other dust containing silica: Secondary | ICD-10-CM | POA: Diagnosis not present

## 2023-07-25 DIAGNOSIS — N1831 Chronic kidney disease, stage 3a: Secondary | ICD-10-CM | POA: Diagnosis not present

## 2023-07-25 DIAGNOSIS — M25562 Pain in left knee: Secondary | ICD-10-CM | POA: Diagnosis not present

## 2023-08-13 DIAGNOSIS — M1712 Unilateral primary osteoarthritis, left knee: Secondary | ICD-10-CM | POA: Diagnosis not present

## 2023-08-13 DIAGNOSIS — R030 Elevated blood-pressure reading, without diagnosis of hypertension: Secondary | ICD-10-CM | POA: Diagnosis not present

## 2023-08-20 ENCOUNTER — Other Ambulatory Visit: Payer: Self-pay

## 2023-08-20 ENCOUNTER — Ambulatory Visit: Payer: Medicare HMO | Attending: Internal Medicine

## 2023-08-20 DIAGNOSIS — M25562 Pain in left knee: Secondary | ICD-10-CM | POA: Insufficient documentation

## 2023-08-20 NOTE — Therapy (Signed)
OUTPATIENT PHYSICAL THERAPY LOWER EXTREMITY EVALUATION   Patient Name: Jason SCALISI MRN: [id number] DOB:[birth year], 83 y.o., male Today's Date: 08/20/2023  END OF SESSION:  PT End of Session - 08/20/23 0913     Visit Number 1    Number of Visits 4    Date for PT Re-Evaluation 09/28/23    PT Start Time 0913    PT Stop Time 0954    PT Time Calculation (min) 41 min    Activity Tolerance Patient tolerated treatment well    Behavior During Therapy [facility] for tasks assessed/performed             Past Medical History:  Diagnosis Date   History of kidney stones    Pneumonia    Silicosis (HCC)    Past Surgical History:  Procedure Laterality Date   CHOLECYSTECTOMY  08/09/2015   CHOLECYSTECTOMY N/A 08/07/2015   Procedure: LAPAROSCOPIC CHOLECYSTECTOMY WITH POSSIBLE INTRAOPERATIVE CHOLANGIOGRAM;  Surgeon: Abigail Miyamoto, MD;  Location: [facility];  Service: General;  Laterality: N/A;   COLONOSCOPY WITH PROPOFOL N/A 04/18/2016   Procedure: COLONOSCOPY WITH PROPOFOL;  Surgeon: Charolett Bumpers, MD;  [facility];  Service: Endoscopy;  Laterality: N/A;   Patient Active Problem List   Diagnosis Date Noted   Acute cholecystitis 08/07/2015   DEVIATED NASAL SEPTUM 08/26/2008   Allergic rhinitis 08/26/2008   SILICOSIS 08/26/2008   COUGH 08/26/2008   REFERRING PROVIDER: Georgann Housekeeper, MD   REFERRING DIAG: OA (osteoarthritis) of knee   THERAPY DIAG:  Acute pain of left knee  Rationale for Evaluation and Treatment: Rehabilitation  ONSET DATE: January 2025  SUBJECTIVE:   SUBJECTIVE STATEMENT: Patient reports that his right knee was hurting really bad for a while in January 2025. He does not know of anything that could have caused this pain. However, it has been doing better recently since he began to take Tylenol Arthritis. He had an x-ray which showed some mild spurring, but no other major changes.   PERTINENT HISTORY: Osteoarthritis PAIN:  Are you having pain? Yes:  NPRS scale: slight pain Pain location: left posterior knee and calf Pain description: intermittent ache Aggravating factors: be still for prolonged periods Relieving factors: walking and movement  PRECAUTIONS: None  RED FLAGS: None   WEIGHT BEARING RESTRICTIONS: No  FALLS:  Has patient fallen in last 6 months? No  LIVING ENVIRONMENT: Lives with: lives with their spouse Lives in: House/apartment Stairs: Yes: Internal: 8 steps; on left going up; step to pattern Has following equipment at home: None  OCCUPATION: retired  PLOF: Independent  PATIENT GOALS: decreased pain  NEXT MD VISIT: none scheduled  OBJECTIVE:  Note: Objective measures were completed at Evaluation unless otherwise noted.  COGNITION: Overall cognitive status: Within functional limits for tasks assessed     SENSATION: Patient reports no numbness or tingling  EDEMA:  No lower extremity edema observed  POSTURE: forward head  PALPATION: No tenderness to palpation reported  LOWER EXTREMITY ROM:   Active ROM Right eval Left eval  Hip flexion    Hip extension    Hip abduction    Hip adduction    Hip internal rotation    Hip external rotation    Knee flexion 122 114; "sore"   Knee extension 0 0  Ankle dorsiflexion    Ankle plantarflexion    Ankle inversion    Ankle eversion     (Blank rows = not tested)  LOWER EXTREMITY MMT:  MMT Right eval Left eval  Hip flexion 4/5  4/5  Hip extension    Hip abduction    Hip adduction    Hip internal rotation    Hip external rotation    Knee flexion 4+/5 4+/5  Knee extension 4+/5 5/5  Ankle dorsiflexion    Ankle plantarflexion    Ankle inversion    Ankle eversion     (Blank rows = not tested)  GAIT: Assistive device utilized: None Level of assistance: Complete Independence Comments: no significant gait deviations observed                                                                                                                                 TREATMENT DATE:     PATIENT EDUCATION:  Education details: benefits of exercise, arthritis, healing, prognosis, returning to his exercise classes, and plan of care Person educated: Patient Education method: Explanation Education comprehension: verbalized understanding  HOME EXERCISE PROGRAM:   ASSESSMENT:  CLINICAL IMPRESSION: Patient is a 83 y.o. male who was seen today for physical therapy evaluation and treatment for left knee pain. He presented with low pain severity and irritability with none of today's assessments significantly reproducing his familiar symptoms. He exhibited no significant strength or range of motion deficits at this time. He wished to be placed on hold at this time to return to his prior gym classes and evaluate the effectiveness at addressing his remaining symptoms.   OBJECTIVE IMPAIRMENTS: decreased strength and pain.   ACTIVITY LIMITATIONS: sleeping and stairs  PARTICIPATION LIMITATIONS: community activity  PERSONAL FACTORS: 1 comorbidity: osteoarthritis  are also affecting patient's functional outcome.   REHAB POTENTIAL: Excellent  CLINICAL DECISION MAKING: Stable/uncomplicated  EVALUATION COMPLEXITY: Low   GOALS: Goals reviewed with patient? Yes  LONG TERM GOALS: Target date: 09/17/23  Patient will be independent with his HEP.  Baseline:  Goal status: INITIAL  2.  Patient will be able to navigate at least 4 steps with a reciprocal pattern for improved function with household mobility.  Baseline:  Goal status: INITIAL  3.  Patient will be able to return to his [employer] exercise class without being limited by his familiar left knee symptoms.  Baseline:  Goal status: INITIAL  PLAN:  PT FREQUENCY: 1-2x/week  PT DURATION: 4 weeks  PLANNED INTERVENTIONS: 16109- PT Re-evaluation, 97110-Therapeutic exercises, 97530- Therapeutic activity, 97112- Neuromuscular re-education, 97535- Self Care, 60454- Manual therapy, [id number]-  Electrical stimulation (unattended), 97016- Vasopneumatic device, Patient/Family education, Balance training, Stair training, Joint mobilization, Cryotherapy, and Moist heat  PLAN FOR NEXT SESSION: Nustep, lower extremity strengthening, and update HEP as needed   Granville Lewis, PT 08/20/2023, 12:50 PM

## 2023-08-30 DIAGNOSIS — J069 Acute upper respiratory infection, unspecified: Secondary | ICD-10-CM | POA: Diagnosis not present

## 2023-10-10 DIAGNOSIS — Z03818 Encounter for observation for suspected exposure to other biological agents ruled out: Secondary | ICD-10-CM | POA: Diagnosis not present

## 2023-10-10 DIAGNOSIS — R059 Cough, unspecified: Secondary | ICD-10-CM | POA: Diagnosis not present

## 2023-10-15 DIAGNOSIS — R059 Cough, unspecified: Secondary | ICD-10-CM | POA: Diagnosis not present

## 2023-10-23 DIAGNOSIS — I700 Atherosclerosis of aorta: Secondary | ICD-10-CM | POA: Diagnosis not present

## 2023-10-23 DIAGNOSIS — N200 Calculus of kidney: Secondary | ICD-10-CM | POA: Diagnosis not present

## 2023-10-23 DIAGNOSIS — E038 Other specified hypothyroidism: Secondary | ICD-10-CM | POA: Diagnosis not present

## 2023-10-23 DIAGNOSIS — J309 Allergic rhinitis, unspecified: Secondary | ICD-10-CM | POA: Diagnosis not present

## 2023-10-23 DIAGNOSIS — N1831 Chronic kidney disease, stage 3a: Secondary | ICD-10-CM | POA: Diagnosis not present

## 2023-10-23 DIAGNOSIS — E782 Mixed hyperlipidemia: Secondary | ICD-10-CM | POA: Diagnosis not present

## 2023-10-23 DIAGNOSIS — J628 Pneumoconiosis due to other dust containing silica: Secondary | ICD-10-CM | POA: Diagnosis not present

## 2023-10-23 DIAGNOSIS — R7303 Prediabetes: Secondary | ICD-10-CM | POA: Diagnosis not present

## 2023-10-23 DIAGNOSIS — Z0000 Encounter for general adult medical examination without abnormal findings: Secondary | ICD-10-CM | POA: Diagnosis not present

## 2023-10-23 DIAGNOSIS — R972 Elevated prostate specific antigen [PSA]: Secondary | ICD-10-CM | POA: Diagnosis not present
